# Patient Record
Sex: Female | Born: 1945
Health system: Southern US, Community
[De-identification: ages and names within clinical notes are randomized; demographics above are authoritative.]

## PROBLEM LIST (undated history)

## (undated) DIAGNOSIS — M1990 Unspecified osteoarthritis, unspecified site: Secondary | ICD-10-CM

## (undated) DIAGNOSIS — I452 Bifascicular block: Secondary | ICD-10-CM

## (undated) DIAGNOSIS — E785 Hyperlipidemia, unspecified: Secondary | ICD-10-CM

## (undated) DIAGNOSIS — T7840XA Allergy, unspecified, initial encounter: Secondary | ICD-10-CM

## (undated) DIAGNOSIS — G8929 Other chronic pain: Secondary | ICD-10-CM

## (undated) DIAGNOSIS — L719 Rosacea, unspecified: Secondary | ICD-10-CM

## (undated) DIAGNOSIS — M542 Cervicalgia: Secondary | ICD-10-CM

## (undated) HISTORY — DX: Cervicalgia: M54.2

## (undated) HISTORY — PX: OTHER SURGICAL HISTORY: SHX169

## (undated) HISTORY — DX: Allergy, unspecified, initial encounter: T78.40XA

## (undated) HISTORY — PX: TUBAL LIGATION: SHX77

## (undated) HISTORY — PX: TONSILLECTOMY: SUR1361

## (undated) HISTORY — PX: CHOLECYSTECTOMY: SHX55

## (undated) HISTORY — PX: HYSTEROSCOPY: SHX211

## (undated) HISTORY — DX: Rosacea, unspecified: L71.9

## (undated) HISTORY — DX: Other chronic pain: G89.29

## (undated) HISTORY — DX: Bifascicular block: I45.2

## (undated) HISTORY — DX: Hyperlipidemia, unspecified: E78.5

---

## 1999-06-24 ENCOUNTER — Other Ambulatory Visit: Admission: RE | Admit: 1999-06-24 | Discharge: 1999-06-24 | Payer: Self-pay | Admitting: Gynecology

## 2000-05-24 ENCOUNTER — Other Ambulatory Visit: Admission: RE | Admit: 2000-05-24 | Discharge: 2000-05-24 | Payer: Self-pay | Admitting: Gynecology

## 2001-05-25 ENCOUNTER — Other Ambulatory Visit: Admission: RE | Admit: 2001-05-25 | Discharge: 2001-05-25 | Payer: Self-pay | Admitting: Gynecology

## 2001-05-26 ENCOUNTER — Encounter: Payer: Self-pay | Admitting: Gynecology

## 2001-05-26 ENCOUNTER — Encounter: Admission: RE | Admit: 2001-05-26 | Discharge: 2001-05-26 | Payer: Self-pay | Admitting: Gynecology

## 2001-09-25 ENCOUNTER — Encounter: Payer: Self-pay | Admitting: Gynecology

## 2001-09-25 ENCOUNTER — Encounter: Admission: RE | Admit: 2001-09-25 | Discharge: 2001-09-25 | Payer: Self-pay | Admitting: Gynecology

## 2002-06-07 ENCOUNTER — Other Ambulatory Visit: Admission: RE | Admit: 2002-06-07 | Discharge: 2002-06-07 | Payer: Self-pay | Admitting: Gynecology

## 2002-06-07 ENCOUNTER — Encounter: Admission: RE | Admit: 2002-06-07 | Discharge: 2002-06-07 | Payer: Self-pay | Admitting: Gynecology

## 2002-06-07 ENCOUNTER — Encounter: Payer: Self-pay | Admitting: Gynecology

## 2003-06-11 ENCOUNTER — Other Ambulatory Visit: Admission: RE | Admit: 2003-06-11 | Discharge: 2003-06-11 | Payer: Self-pay | Admitting: Gynecology

## 2003-10-07 ENCOUNTER — Encounter: Admission: RE | Admit: 2003-10-07 | Discharge: 2003-10-07 | Payer: Self-pay | Admitting: Gynecology

## 2003-11-04 ENCOUNTER — Ambulatory Visit (HOSPITAL_COMMUNITY): Admission: RE | Admit: 2003-11-04 | Discharge: 2003-11-04 | Payer: Self-pay | Admitting: Gastroenterology

## 2003-11-04 ENCOUNTER — Encounter: Payer: Self-pay | Admitting: Internal Medicine

## 2004-03-12 ENCOUNTER — Ambulatory Visit (HOSPITAL_BASED_OUTPATIENT_CLINIC_OR_DEPARTMENT_OTHER): Admission: RE | Admit: 2004-03-12 | Discharge: 2004-03-12 | Payer: Self-pay | Admitting: Gynecology

## 2004-03-12 ENCOUNTER — Encounter (INDEPENDENT_AMBULATORY_CARE_PROVIDER_SITE_OTHER): Payer: Self-pay | Admitting: Specialist

## 2004-03-12 ENCOUNTER — Ambulatory Visit (HOSPITAL_COMMUNITY): Admission: RE | Admit: 2004-03-12 | Discharge: 2004-03-12 | Payer: Self-pay | Admitting: Gynecology

## 2004-06-16 ENCOUNTER — Other Ambulatory Visit: Admission: RE | Admit: 2004-06-16 | Discharge: 2004-06-16 | Payer: Self-pay | Admitting: Gynecology

## 2004-10-08 ENCOUNTER — Ambulatory Visit (HOSPITAL_COMMUNITY): Admission: RE | Admit: 2004-10-08 | Discharge: 2004-10-08 | Payer: Self-pay | Admitting: Gynecology

## 2004-10-20 ENCOUNTER — Ambulatory Visit: Payer: Self-pay | Admitting: Internal Medicine

## 2005-07-28 ENCOUNTER — Other Ambulatory Visit: Admission: RE | Admit: 2005-07-28 | Discharge: 2005-07-28 | Payer: Self-pay | Admitting: Gynecology

## 2005-08-09 ENCOUNTER — Ambulatory Visit: Payer: Self-pay | Admitting: Internal Medicine

## 2005-08-16 ENCOUNTER — Ambulatory Visit: Payer: Self-pay | Admitting: Internal Medicine

## 2005-09-29 ENCOUNTER — Ambulatory Visit: Payer: Self-pay | Admitting: Internal Medicine

## 2005-10-18 ENCOUNTER — Ambulatory Visit (HOSPITAL_COMMUNITY): Admission: RE | Admit: 2005-10-18 | Discharge: 2005-10-18 | Payer: Self-pay | Admitting: Gynecology

## 2005-11-18 ENCOUNTER — Ambulatory Visit: Payer: Self-pay | Admitting: Internal Medicine

## 2006-04-19 ENCOUNTER — Ambulatory Visit: Payer: Self-pay | Admitting: Internal Medicine

## 2006-08-17 ENCOUNTER — Ambulatory Visit: Payer: Self-pay | Admitting: Internal Medicine

## 2006-09-05 ENCOUNTER — Encounter: Payer: Self-pay | Admitting: Internal Medicine

## 2006-09-05 ENCOUNTER — Ambulatory Visit: Payer: Self-pay | Admitting: Internal Medicine

## 2006-09-05 DIAGNOSIS — E785 Hyperlipidemia, unspecified: Secondary | ICD-10-CM | POA: Insufficient documentation

## 2006-09-05 DIAGNOSIS — J309 Allergic rhinitis, unspecified: Secondary | ICD-10-CM | POA: Insufficient documentation

## 2006-09-05 LAB — CONVERTED CEMR LAB
Cholesterol, target level: 200 mg/dL
HDL goal, serum: 40 mg/dL
LDL Goal: 160 mg/dL

## 2006-09-19 ENCOUNTER — Other Ambulatory Visit: Admission: RE | Admit: 2006-09-19 | Discharge: 2006-09-19 | Payer: Self-pay | Admitting: Gynecology

## 2006-10-18 ENCOUNTER — Ambulatory Visit: Payer: Self-pay | Admitting: Internal Medicine

## 2006-10-19 ENCOUNTER — Ambulatory Visit (HOSPITAL_COMMUNITY): Admission: RE | Admit: 2006-10-19 | Discharge: 2006-10-19 | Payer: Self-pay | Admitting: Gynecology

## 2007-09-11 ENCOUNTER — Ambulatory Visit: Payer: Self-pay | Admitting: Internal Medicine

## 2007-09-11 LAB — CONVERTED CEMR LAB
ALT: 27 units/L (ref 0–35)
AST: 26 units/L (ref 0–37)
Albumin: 4.2 g/dL (ref 3.5–5.2)
Alkaline Phosphatase: 63 units/L (ref 39–117)
BUN: 21 mg/dL (ref 6–23)
Basophils Absolute: 0 10*3/uL (ref 0.0–0.1)
Basophils Relative: 0.8 % (ref 0.0–1.0)
Bilirubin Urine: NEGATIVE
Bilirubin, Direct: 0.2 mg/dL (ref 0.0–0.3)
CO2: 30 meq/L (ref 19–32)
Calcium: 9.6 mg/dL (ref 8.4–10.5)
Chloride: 106 meq/L (ref 96–112)
Cholesterol: 205 mg/dL (ref 0–200)
Creatinine, Ser: 0.8 mg/dL (ref 0.4–1.2)
Direct LDL: 143.4 mg/dL
Eosinophils Absolute: 0.2 10*3/uL (ref 0.0–0.6)
Eosinophils Relative: 4.9 % (ref 0.0–5.0)
GFR calc Af Amer: 93 mL/min
GFR calc non Af Amer: 77 mL/min
Glucose, Bld: 100 mg/dL — ABNORMAL HIGH (ref 70–99)
Glucose, Urine, Semiquant: NEGATIVE
HCT: 41.2 % (ref 36.0–46.0)
HDL: 48.4 mg/dL (ref >39.0)
Hemoglobin: 14.2 g/dL (ref 12.0–15.0)
Ketones, urine, test strip: NEGATIVE
Lymphocytes Relative: 37.8 % (ref 12.0–46.0)
MCHC: 34.4 g/dL (ref 30.0–36.0)
MCV: 90.8 fL (ref 78.0–100.0)
Monocytes Absolute: 0.3 10*3/uL (ref 0.2–0.7)
Monocytes Relative: 7 % (ref 3.0–11.0)
Neutro Abs: 2.5 10*3/uL (ref 1.4–7.7)
Neutrophils Relative %: 49.5 % (ref 43.0–77.0)
Nitrite: NEGATIVE
Platelets: 241 10*3/uL (ref 150–400)
Potassium: 5 meq/L (ref 3.5–5.1)
RBC: 4.54 M/uL (ref 3.87–5.11)
RDW: 12.7 % (ref 11.5–14.6)
Sodium: 143 meq/L (ref 135–145)
Specific Gravity, Urine: 1.02
TSH: 3.22 microintl units/mL (ref 0.35–5.50)
Total Bilirubin: 0.7 mg/dL (ref 0.3–1.2)
Total CHOL/HDL Ratio: 4.2
Total Protein: 6.5 g/dL (ref 6.0–8.3)
Triglycerides: 80 mg/dL (ref 0–149)
Urobilinogen, UA: NEGATIVE
VLDL: 16 mg/dL (ref 0–40)
WBC: 4.9 10*3/uL (ref 4.5–10.5)
pH: 6

## 2007-09-25 ENCOUNTER — Ambulatory Visit: Payer: Self-pay | Admitting: Internal Medicine

## 2007-09-25 DIAGNOSIS — R82998 Other abnormal findings in urine: Secondary | ICD-10-CM | POA: Insufficient documentation

## 2007-09-25 LAB — CONVERTED CEMR LAB: Pap Smear: NORMAL

## 2007-10-23 ENCOUNTER — Ambulatory Visit (HOSPITAL_COMMUNITY): Admission: RE | Admit: 2007-10-23 | Discharge: 2007-10-23 | Payer: Self-pay | Admitting: Gynecology

## 2007-11-01 ENCOUNTER — Encounter: Admission: RE | Admit: 2007-11-01 | Discharge: 2007-11-01 | Payer: Self-pay | Admitting: Gynecology

## 2007-11-17 ENCOUNTER — Ambulatory Visit: Payer: Self-pay | Admitting: Internal Medicine

## 2008-01-23 ENCOUNTER — Telehealth: Payer: Self-pay | Admitting: Internal Medicine

## 2008-07-18 ENCOUNTER — Encounter: Payer: Self-pay | Admitting: Internal Medicine

## 2008-09-18 ENCOUNTER — Ambulatory Visit: Payer: Self-pay | Admitting: Internal Medicine

## 2008-09-18 LAB — CONVERTED CEMR LAB
ALT: 31 units/L (ref 0–35)
AST: 24 units/L (ref 0–37)
Alkaline Phosphatase: 76 units/L (ref 39–117)
Basophils Absolute: 0 10*3/uL (ref 0.0–0.1)
Basophils Relative: 0.4 % (ref 0.0–3.0)
Bilirubin, Direct: 0.1 mg/dL (ref 0.0–0.3)
CO2: 30 meq/L (ref 19–32)
Chloride: 106 meq/L (ref 96–112)
Creatinine, Ser: 0.8 mg/dL (ref 0.4–1.2)
Direct LDL: 132.6 mg/dL
Eosinophils Absolute: 0.2 10*3/uL (ref 0.0–0.7)
GFR calc non Af Amer: 77 mL/min
HDL: 55.8 mg/dL (ref >39.0)
Lymphocytes Relative: 32.7 % (ref 12.0–46.0)
MCHC: 34.9 g/dL (ref 30.0–36.0)
MCV: 92.8 fL (ref 78.0–100.0)
Neutrophils Relative %: 57.9 % (ref 43.0–77.0)
Nitrite: NEGATIVE
Platelets: 229 10*3/uL (ref 150–400)
Potassium: 4.2 meq/L (ref 3.5–5.1)
RBC: 4.68 M/uL (ref 3.87–5.11)
Sodium: 144 meq/L (ref 135–145)
Total Bilirubin: 0.7 mg/dL (ref 0.3–1.2)
Triglycerides: 75 mg/dL (ref 0–149)
VLDL: 15 mg/dL (ref 0–40)
WBC Urine, dipstick: NEGATIVE
WBC: 5.9 10*3/uL (ref 4.5–10.5)
pH: 5.5

## 2008-09-25 ENCOUNTER — Ambulatory Visit: Payer: Self-pay | Admitting: Internal Medicine

## 2008-09-25 DIAGNOSIS — R319 Hematuria, unspecified: Secondary | ICD-10-CM | POA: Insufficient documentation

## 2008-09-25 LAB — CONVERTED CEMR LAB
Bilirubin Urine: NEGATIVE
Blood in Urine, dipstick: NEGATIVE
Ketones, urine, test strip: NEGATIVE
Nitrite: NEGATIVE
Protein, U semiquant: NEGATIVE
Urobilinogen, UA: 0.2

## 2008-09-30 ENCOUNTER — Encounter: Payer: Self-pay | Admitting: Internal Medicine

## 2008-11-06 ENCOUNTER — Ambulatory Visit (HOSPITAL_COMMUNITY): Admission: RE | Admit: 2008-11-06 | Discharge: 2008-11-06 | Payer: Self-pay | Admitting: Gynecology

## 2008-11-11 ENCOUNTER — Encounter: Payer: Self-pay | Admitting: Internal Medicine

## 2008-11-14 ENCOUNTER — Encounter: Payer: Self-pay | Admitting: Internal Medicine

## 2008-11-14 ENCOUNTER — Encounter: Admission: RE | Admit: 2008-11-14 | Discharge: 2008-11-14 | Payer: Self-pay | Admitting: Gynecology

## 2008-11-18 ENCOUNTER — Ambulatory Visit: Payer: Self-pay | Admitting: Internal Medicine

## 2008-12-05 ENCOUNTER — Encounter: Payer: Self-pay | Admitting: Internal Medicine

## 2008-12-26 ENCOUNTER — Encounter: Payer: Self-pay | Admitting: Internal Medicine

## 2008-12-26 ENCOUNTER — Encounter: Admission: RE | Admit: 2008-12-26 | Discharge: 2008-12-26 | Payer: Self-pay | Admitting: Surgery

## 2008-12-31 ENCOUNTER — Encounter: Payer: Self-pay | Admitting: Internal Medicine

## 2009-01-02 ENCOUNTER — Encounter: Payer: Self-pay | Admitting: Internal Medicine

## 2009-01-21 ENCOUNTER — Encounter: Payer: Self-pay | Admitting: Internal Medicine

## 2009-01-22 ENCOUNTER — Telehealth: Payer: Self-pay | Admitting: Internal Medicine

## 2009-04-25 ENCOUNTER — Telehealth (INDEPENDENT_AMBULATORY_CARE_PROVIDER_SITE_OTHER): Payer: Self-pay | Admitting: *Deleted

## 2009-04-30 ENCOUNTER — Encounter: Admission: RE | Admit: 2009-04-30 | Discharge: 2009-04-30 | Payer: Self-pay | Admitting: Internal Medicine

## 2009-05-19 ENCOUNTER — Encounter: Payer: Self-pay | Admitting: Internal Medicine

## 2009-10-22 ENCOUNTER — Ambulatory Visit: Payer: Self-pay | Admitting: Internal Medicine

## 2009-10-22 LAB — CONVERTED CEMR LAB: Vit D, 25-Hydroxy: 62 ng/mL (ref 30–89)

## 2009-10-27 LAB — CONVERTED CEMR LAB
Alkaline Phosphatase: 72 units/L (ref 39–117)
BUN: 19 mg/dL (ref 6–23)
Bilirubin, Direct: 0 mg/dL (ref 0.0–0.3)
Calcium: 9.4 mg/dL (ref 8.4–10.5)
Chloride: 106 meq/L (ref 96–112)
Cholesterol: 236 mg/dL — ABNORMAL HIGH (ref 0–200)
Creatinine, Ser: 0.8 mg/dL (ref 0.4–1.2)
Direct LDL: 164.4 mg/dL
Eosinophils Absolute: 0.1 10*3/uL (ref 0.0–0.7)
Eosinophils Relative: 1.9 % (ref 0.0–5.0)
Lymphocytes Relative: 32.8 % (ref 12.0–46.0)
MCHC: 33.7 g/dL (ref 30.0–36.0)
MCV: 94.5 fL (ref 78.0–100.0)
Monocytes Absolute: 0.3 10*3/uL (ref 0.1–1.0)
Neutrophils Relative %: 59 % (ref 43.0–77.0)
Platelets: 235 10*3/uL (ref 150.0–400.0)
Total Bilirubin: 1 mg/dL (ref 0.3–1.2)
Total CHOL/HDL Ratio: 4
Triglycerides: 90 mg/dL (ref 0.0–149.0)
VLDL: 18 mg/dL (ref 0.0–40.0)
WBC: 5.8 10*3/uL (ref 4.5–10.5)

## 2009-11-13 ENCOUNTER — Encounter: Admission: RE | Admit: 2009-11-13 | Discharge: 2009-11-13 | Payer: Self-pay | Admitting: Gynecology

## 2009-11-29 HISTORY — PX: EYE SURGERY: SHX253

## 2010-01-13 ENCOUNTER — Ambulatory Visit: Payer: Self-pay | Admitting: Family Medicine

## 2010-05-06 ENCOUNTER — Telehealth: Payer: Self-pay | Admitting: Internal Medicine

## 2010-06-15 ENCOUNTER — Encounter: Payer: Self-pay | Admitting: Internal Medicine

## 2010-09-03 ENCOUNTER — Ambulatory Visit: Payer: Self-pay | Admitting: Internal Medicine

## 2010-09-03 LAB — CONVERTED CEMR LAB
HCV Ab: NEGATIVE
Hep B Core Total Ab: NEGATIVE

## 2010-10-26 ENCOUNTER — Ambulatory Visit: Payer: Self-pay | Admitting: Internal Medicine

## 2010-10-26 LAB — CONVERTED CEMR LAB
AST: 24 units/L (ref 0–37)
Albumin: 4.3 g/dL (ref 3.5–5.2)
Alkaline Phosphatase: 72 units/L (ref 39–117)
Basophils Absolute: 0 10*3/uL (ref 0.0–0.1)
Basophils Relative: 0.8 % (ref 0.0–3.0)
CO2: 29 meq/L (ref 19–32)
Calcium: 9.5 mg/dL (ref 8.4–10.5)
Cholesterol: 221 mg/dL — ABNORMAL HIGH (ref 0–200)
Creatinine, Ser: 0.8 mg/dL (ref 0.4–1.2)
Eosinophils Absolute: 0.2 10*3/uL (ref 0.0–0.7)
MCHC: 34.4 g/dL (ref 30.0–36.0)
MCV: 92.4 fL (ref 78.0–100.0)
Monocytes Absolute: 0.3 10*3/uL (ref 0.1–1.0)
Neutrophils Relative %: 54.4 % (ref 43.0–77.0)
Platelets: 261 10*3/uL (ref 150.0–400.0)
RDW: 14 % (ref 11.5–14.6)
TSH: 3.42 microintl units/mL (ref 0.35–5.50)
Triglycerides: 137 mg/dL (ref 0.0–149.0)

## 2010-10-30 LAB — CONVERTED CEMR LAB: Vit D, 25-Hydroxy: 84 ng/mL (ref 30–89)

## 2010-11-16 ENCOUNTER — Encounter
Admission: RE | Admit: 2010-11-16 | Discharge: 2010-11-16 | Payer: Self-pay | Source: Home / Self Care | Attending: Gynecology | Admitting: Gynecology

## 2010-12-20 ENCOUNTER — Encounter: Payer: Self-pay | Admitting: Gynecology

## 2010-12-29 NOTE — Assessment & Plan Note (Signed)
Summary: ear and neck clogged-ok per Rhonda [name]//ccm   Vital Signs:  Patient profile:   65 year old female Menstrual status:  postmenopausal Weight:      135 pounds Temp:     97.9 degrees F oral BP sitting:   110 / 78  (left arm) Cuff size:   regular  Vitals Entered By: Rhonda [name]) (January 13, 2010 9:28 AM)  Reason for Visit right ear pain and sore throat  History of Present Illness: Rhonda [name] is a 65 year old female, who comes in today for evaluation of a sore throat, and a right-sided earache x 1 day.  She states that her swimming couch at the [place] has had strep throat.  Yesterday she developed a sore throat, and a right-sided earache.  No fever, chills, cough, etc.    Allergies: No Known Drug Allergies  Past History:  Past medical, surgical, family and social histories (including risk factors) reviewed for relevance to current acute and chronic problems.  Past Medical History: Reviewed history from 09/05/2006 and no changes required. Rosacea Allergic rhinitis Hyperlipidemia chronic intermittent neck pain RBBB  Past Surgical History: Reviewed history from 10/22/2009 and no changes required. Tubal ligation Tonsillectomy BCC excision hysteroscopy Cholecystectomy  Family History: Reviewed history from 09/05/2006 and no changes required. Father: 6 mother: 43, Intraperitoneal catastrophe Brother: 2 alive and well Sisters 2 alive and well  Social History: Reviewed history from 09/05/2006 and no changes required. Occupation: Former Smoker Alcohol use-yes Regular exercise-yes walks daily in addition to water aerobics  Review of Systems      See HPI  Physical Exam  General:  Well-developed,well-nourished,in no acute distress; alert,appropriate and cooperative throughout examination Head:  Normocephalic and atraumatic without obvious abnormalities. No apparent alopecia or balding. Eyes:  No corneal or conjunctival inflammation noted. EOMI. Perrla.  Funduscopic exam benign, without hemorrhages, exudates or papilledema. Vision grossly normal. Ears:  External ear exam shows no significant lesions or deformities.  Otoscopic examination reveals clear canals, tympanic membranes are intact bilaterally without bulging, retraction, inflammation or discharge. Hearing is grossly normal bilaterally. Nose:  External nasal examination shows no deformity or inflammation. Nasal mucosa are pink and moist without lesions or exudates. Mouth:  red throat.  Strep test done Neck:  No deformities, masses, or tenderness noted.   Problems:  Medical Problems Added: 1)  Dx of Sore Throat  (ICD-462) 2)  Dx of Viral Infection-unspec  (ICD-079.99)  Impression & Recommendations:  Problem # 1:  VIRAL INFECTION-UNSPEC (ICD-079.99) Assessment New  Her updated medication list for this problem includes:    Zyrtec-d Allergy & Congestion 5-120 Mg Tb12 (Cetirizine-pseudoephedrine) ..... One by mouth daily    Hydromet 5-1.5 Mg/28ml Syrp (Hydrocodone-homatropine) .Marland Kitchen... 1 or 2 tsps at bedtime as needed  Complete Medication List: 1)  Vivelle-dot 0.025 Mg/24hr Pttw (Estradiol) .... 2 x weekly 2)  Prometrium 200 Mg Caps (Progesterone micronized) .... Q 4 months 3)  Zyrtec-d Allergy & Congestion 5-120 Mg Tb12 (Cetirizine-pseudoephedrine) .... One by mouth daily 4)  Vitamin D 1000 Unit Caps (Cholecalciferol) .... Two times a day 5)  Chlorpheniramine Maleate Cr 12 Mg Cpcr (Chlorpheniramine maleate) .Marland Kitchen.. 1 at bedtime 6)  Metrogel 1 % Gel (Metronidazole) .... As directed 7)  Magnesium 250 Mg Tabs (Magnesium) .... Once daily 8)  Calcium Carbonate 600 Mg Tabs (Calcium carbonate) .Marland Kitchen.. 1 1/2 once daily 9)  Hydromet 5-1.5 Mg/83ml Syrp (Hydrocodone-homatropine) .Marland Kitchen.. 1 or 2 tsps at bedtime as needed  Other Orders: Rapid Strep (10272)  Patient Instructions: 1)  drink  lots of water, Chloraseptic lozenges help.  He uses sore throat.  Also, one or 2 teaspoons of Hydromet at bedtime 2)   Please schedule a follow-up appointment as needed. Prescriptions: HYDROMET 5-1.5 MG/5ML SYRP (HYDROCODONE-HOMATROPINE) 1 or 2 tsps at bedtime as needed  #8oz x 1   Entered and Authorized by:   Roderick Pee MD   Signed by:   Roderick Pee MD on 01/13/2010   Method used:   Print then Give to Patient   RxID:   [id number]   Laboratory Results  Date/Time Received: January 13, 2010   Other Tests  Rapid Strep: negative Comments: Rhonda [name])  January 13, 2010 10:22 AM

## 2010-12-29 NOTE — Assessment & Plan Note (Signed)
Summary: flu shot/pt req pneumonia vaccine if needed//cjr   Nurse Visit   Allergies: No Known Drug Allergies  Flu Vaccine Consent Questions:    Do you have a history of severe allergic reactions to this vaccine? no  Orders Added: 1)  Admin 1st Vaccine [90471] 2)  Flu Vaccine 69yrs + [16109] Flu Vaccine Consent Questions     Do you have a history of severe allergic reactions to this vaccine? no    Any prior history of allergic reactions to egg and/or gelatin? no    Do you have a sensitivity to the preservative Thimersol? no    Do you have a past history of Guillan-Barre Syndrome? no    Do you currently have an acute febrile illness? no    Have you ever had a severe reaction to latex? no    Vaccine information given and explained to patient? yes    Are you currently pregnant? no    Lot Number:[id number]   Exp Date:05/29/2011   Site Given  Left Deltoid IM .lbflu

## 2010-12-29 NOTE — Letter (Signed)
Summary: [facility]  [facility]   Imported By: Maryln Gottron 06/19/2010 11:10:09  _____________________________________________________________________  External Attachment:    Type:   Image     Comment:   External Document

## 2010-12-29 NOTE — Progress Notes (Signed)
Summary: question  Phone Note Call from Patient Call back at Home Phone [phone]   Caller: Patient via note Call For: Birdie Sons MD Summary of Call: Has cataracts and wants to have surgery.  No longer has faith with [place] opthalmologist.  Asking if you would recommend Dr Haskell Riling from [facility].  Has appt but wants your input. Do you have an opinion about [facility] in [place] or this MD?  Makes note to thank you for your advise. Wants you to know she has "quite a correction as was on straight oxygen for 5 weeks as a newborn".  OK to leave message on answering machine. Initial call taken by: Gladis Riffle, RN,  May 06, 2010 12:21 PM  Follow-up for Phone Call        [facility] Armstead [facility] also good Follow-up by: Birdie Sons MD,  May 06, 2010 4:26 PM  Additional Follow-up for Phone Call Additional follow up Details #1::        Patient notified.  Additional Follow-up by: Gladis Riffle, RN,  May 07, 2010 8:24 AM

## 2010-12-29 NOTE — Assessment & Plan Note (Signed)
Summary: PT WILL COME FASTING/NJR   Vital Signs:  Patient profile:   65 year old female Menstrual status:  postmenopausal Height:      62.25 inches Weight:      132 pounds BMI:     24.04 Temp:     98.0 degrees F oral Pulse rate:   76 / minute Pulse rhythm:   regular BP sitting:   104 / 68  (left arm) Cuff size:   regular  Vitals Entered By: Alfred Levins, CMA (October 26, 2010 8:08 AM) CC: cpx, no pap, fasting   CC:  cpx, no pap, and fasting.  History of Present Illness: Here for Medicare AWV:  1.   Risk factors based on Past M, S, F history: see patient record. 2.   Physical Activities: she remains physically active. 3.   Depression/mood: she denies. 4.   Hearing: no concerns. 5.   ADL's: she performs all of her ADLs. 6.   Fall Risk: none noted. 7.   Home Safety: no concerns. 8.   Height, weight, &visual acuity:see physical exam. She gets yearly ophthalmologic exam. 9.   Counseling: advised to maintain physical activity. He should exercise at least 30 minutes every day. 10.   Labs ordered based on risk factors: see orders.  11.           Referral Coordination: none necessary. 12.           Care Plan: advised regular exercise. 13.            Cognitive Assessment: no concerns.   Current Problems:  CHOLEDOCHOLITHIASIS (ICD-574.50)--no symptoms. HEMATURIA (ICD-599.70)--chronic problem. She has had evaluated. HYPERLIPIDEMIA (ICD-272.4)--not on any medications.  ALLERGIC RHINITIS (ICD-477.9)-doing well on current regimen.    Current Problems (verified): 1)  Choledocholithiasis  (ICD-574.50) 2)  Hematuria  (ICD-599.70) 3)  Pyuria  (ICD-791.9) 4)  Physical Examination  (ICD-V70.0) 5)  Hyperlipidemia  (ICD-272.4) 6)  Allergic Rhinitis  (ICD-477.9)  Current Medications (verified): 1)  Vivelle-Dot 0.025 Mg/24hr  Pttw (Estradiol) .... 2 X Weekly 2)  Prometrium 200 Mg Caps (Progesterone Micronized) .... Q 4 Months 3)  Zyrtec-D Allergy & Congestion 5-120 Mg  Tb12  (Cetirizine-Pseudoephedrine) .... One By Mouth Daily 4)  Vitamin D 1000 Unit Caps (Cholecalciferol) .... Two Times A Day 5)  Chlorpheniramine Maleate Cr 12 Mg  Cpcr (Chlorpheniramine Maleate) .Marland Kitchen.. 1 At Bedtime 6)  Metrogel 1 %  Gel (Metronidazole) .... As Directed 7)  Magnesium 250 Mg Tabs (Magnesium) .... Once Daily 8)  Calcium Carbonate 600 Mg Tabs (Calcium Carbonate) .Marland Kitchen.. 1 1/2 Once Daily  Allergies (verified): No Known Drug Allergies  Past History:  Past Medical History: Last updated: 09/05/2006 Rosacea Allergic rhinitis Hyperlipidemia chronic intermittent neck pain RBBB  Past Surgical History: Last updated: 10/22/2009 Tubal ligation Tonsillectomy BCC excision hysteroscopy Cholecystectomy  Family History: Last updated: 10/26/2010 Father: 42--? cancer mother: 22, Intraperitoneal catastrophe Brother: 2 alive and well Sisters 2 alive and well  Social History: Last updated: 10/26/2010 Occupation: retired Former Smoker Alcohol use-yes Regular exercise-yes walks daily in addition to water aerobics  Risk Factors: Exercise: yes (09/05/2006)  Risk Factors: Smoking Status: quit > 6 months (10/22/2009)  Family History: Father: 81--? cancer mother: 56, Intraperitoneal catastrophe Brother: 2 alive and well Sisters 2 alive and well  Social History: Occupation: retired Former Smoker Alcohol use-yes Regular exercise-yes walks daily in addition to water aerobics  Physical Exam  General:  well-developed well-nourished female in no acute distress. HEENT exam atraumatic, normocephalic symmetric muscles are intact.  Neck is supple without lymphadenopathy or thyromegaly. Chest clear to auscultation. Cardiac exam S1-S2 are regular without murmurs or gallops heard abdominal exam active bowel sounds, soft and nontender. No hepatosplenomegaly. Aorta is normal size. Extremities no clubbing cyanosis or edema. Neurologic exam she is alert without any motor or sensory  deficits.   Impression & Recommendations:  Problem # 1:  PHYSICAL EXAMINATION (ICD-V70.0) health maintenance up-to-date. Orders: Medicare -1st Annual Wellness Visit [phone])  Problem # 2:  HYPERLIPIDEMIA (ICD-272.4) check labs today. Orders: TLB-Lipid Panel (80061-LIPID) TLB-TSH (Thyroid Stimulating Hormone) (84443-TSH)  Problem # 3:  HEMATURIA (ICD-599.70) reviewed urology note. We'll check labs today. Orders: Venipuncture (13244) TLB-CBC Platelet - w/Differential (85025-CBCD) TLB-BMP (Basic Metabolic Panel-BMET) (80048-METABOL) UA Dipstick w/o Micro (automated)  (81003)  Complete Medication List: 1)  Vivelle-dot 0.025 Mg/24hr Pttw (Estradiol) .... 2 x weekly 2)  Prometrium 200 Mg Caps (Progesterone micronized) .... Q 4 months 3)  Zyrtec-d Allergy & Congestion 5-120 Mg Tb12 (Cetirizine-pseudoephedrine) .... One by mouth daily 4)  Vitamin D 1000 Unit Caps (Cholecalciferol) .... Two times a day 5)  Chlorpheniramine Maleate Cr 12 Mg Cpcr (Chlorpheniramine maleate) .Marland Kitchen.. 1 at bedtime 6)  Metrogel 1 % Gel (Metronidazole) .... As directed 7)  Magnesium 250 Mg Tabs (Magnesium) .... Once daily 8)  Calcium Carbonate 600 Mg Tabs (Calcium carbonate) .Marland Kitchen.. 1 1/2 once daily  Other Orders: TLB-Hepatic/Liver Function Pnl (80076-HEPATIC) T-Vitamin D (25-Hydroxy) [phone])  Preventive Care Screening  Colonoscopy:    Next Due:  11/2012  Mammogram:    Date:  10/29/2009    Next Due:  11/2011    Results:  normal   Pap Smear:    Date:  10/29/2009    Next Due:  10/2012    Results:  normal   Bone Density:    Date:  10/29/2008    Results:  normal std dev    Orders Added: 1)  Medicare -1st Annual Wellness Visit [G0438] 2)  Est. Patient Level III [44034] 3)  Venipuncture [74259] 4)  TLB-CBC Platelet - w/Differential [85025-CBCD] 5)  TLB-Lipid Panel [80061-LIPID] 6)  TLB-TSH (Thyroid Stimulating Hormone) [84443-TSH] 7)  TLB-Hepatic/Liver Function Pnl [80076-HEPATIC] 8)   TLB-BMP (Basic Metabolic Panel-BMET) [80048-METABOL] 9)  UA Dipstick w/o Micro (automated)  [81003] 10)  T-Vitamin D (25-Hydroxy) [56387-56433]    Preventive Care Screening  Colonoscopy:    Next Due:  11/2012  Mammogram:    Date:  10/29/2009    Next Due:  11/2011    Results:  normal   Pap Smear:    Date:  10/29/2009    Next Due:  10/2012    Results:  normal   Bone Density:    Date:  10/29/2008    Results:  normal std dev   Appended Document: Orders Update     Clinical Lists Changes  Orders: Added new Service order of Specimen Handling (29518) - Signed      Appended Document: PT WILL COME FASTING/NJR  Laboratory Results   Urine Tests    Routine Urinalysis   Color: yellow Appearance: Clear Glucose: negative   (Normal Range: Negative) Bilirubin: 1+   (Normal Range: Negative) Ketone: trace (5)   (Normal Range: Negative) Spec. Gravity: 1.025   (Normal Range: 1.003-1.035) Blood: negative   (Normal Range: Negative) pH: 7.0   (Normal Range: 5.0-8.0) Protein: trace   (Normal Range: Negative) Urobilinogen: 0.2   (Normal Range: 0-1) Nitrite: negative   (Normal Range: Negative) Leukocyte Esterace: trace   (Normal Range: Negative)    Comments: Rita Ohara  October 26, 2010 2:59 PM      Appended Document: PT WILL COME FASTING/NJR

## 2010-12-31 NOTE — Procedures (Signed)
Summary: Colonoscopy Report/Dr. Danise Edge  Colonoscopy Report/Dr. Danise Edge   Imported By: Maryln Gottron 11/12/2010 12:20:52  _____________________________________________________________________  External Attachment:    Type:   Image     Comment:   External Document

## 2011-03-31 ENCOUNTER — Telehealth: Payer: Self-pay | Admitting: *Deleted

## 2011-03-31 NOTE — Telephone Encounter (Signed)
Pt has a mole on her retina noted by her Opthamalogist.  He told her that usually when that occurs, there is cancer in another part of the body.  Wonders if she should have another colonoscopy.  Last one 2004.  Do you have any other thoughts about if and what she should have screened?

## 2011-04-02 NOTE — Telephone Encounter (Signed)
Dr. Cato Mulligan would like to see the note from the Opthamalogist, and have him recommend what testing he would suggest. We can make a GI referral for consult with Dr. Daphine Deutscher to see what they think about repeating the colonoscopy. Per Dr. Cato Mulligan...Marland KitchenMarland KitchenMarland Kitchenhave consult from Opthamalogist sent to him, schedule appt with GI.  Pt notified and will do this.

## 2011-04-16 NOTE — Op Note (Signed)
NAME:  Rhonda [name], Rhonda [name]                         ACCOUNT NO.:  [id number]   MEDICAL RECORD NO.:  [id number]                   PATIENT TYPE:  AMB   LOCATION:  ENDO                                 FACILITY:  [facility]   PHYSICIAN:  Danise Edge, M.D.                DATE OF BIRTH:  07-07-1946   DATE OF PROCEDURE:  11/04/2003  DATE OF DISCHARGE:                                 OPERATIVE REPORT   PROCEDURE:  Colonoscopy.   REFERRING PHYSICIANS:  Gretta Cool, M.D., and Valetta Mole. Swords, M.D., of  [facility].   PROCEDURE INDICATION:  Ms. Rhonda [name] is a 65 year old female born  24-Oct-1946.  Ms. Rhonda [name] is scheduled to undergo her first screening  colonoscopy with polypectomy to prevent colon cancer.   ENDOSCOPIST:  Danise Edge, M.D.   PREMEDICATION:  Versed 5 mg, Demerol 50 mg.   DESCRIPTION OF PROCEDURE:  After obtaining informed consent, Ms. Rhonda [name] was  placed in the left lateral decubitus position.  I administered intravenous  Demerol and intravenous Versed to achieve conscious sedation for the  procedure.  The patient's blood pressure, oxygen saturation, and cardiac  rhythm were monitored throughout the procedure and documented in the medical  record.   Anal inspection was normal.  Digital rectal exam was normal.  The Olympus  adjustable pediatric colonoscope was introduced into the rectum and advanced  to the cecum.  Colonic preparation for the exam today was excellent.   Rectum normal.   Sigmoid colon and descending colon normal.   Splenic flexure normal.   Transverse colon normal.   Hepatic flexure normal.   Ascending colon normal.   Cecum and ileocecal valve normal.   ASSESSMENT:  Normal screening proctocolonoscopy.  No endoscopic evidence for  the presence of colorectal neoplasia.                                               Danise Edge, M.D.    MJ/MEDQ  D:  11/04/2003  T:  11/04/2003  Job:  [id number]   cc:   Gretta Cool, M.D.  [address] [place]  [place] [address]  Fax: [phone]   Valetta Mole. Swords, M.D. [facility]

## 2011-04-16 NOTE — Op Note (Signed)
NAME:  Rhonda [name], Rhonda [name]                         ACCOUNT NO.:  [id number]   MEDICAL RECORD NO.:  [id number]                   PATIENT TYPE:  AMB   LOCATION:  [facility]                                 FACILITY:  [facility]   PHYSICIAN:  Gretta Cool, M.D.              DATE OF BIRTH:  03/12/1946   DATE OF PROCEDURE:  03/12/2004  DATE OF DISCHARGE:                                 OPERATIVE REPORT   PREOPERATIVE DIAGNOSIS:  Uterine polyp versus fibroids with abnormal uterine  bleeding.   POSTOPERATIVE DIAGNOSIS:  Submucous fibroids, multiple with abnormal uterine  bleeding.   PROCEDURE:  Hysteroscopy, resection of multiple submucous fibroids, total  endometrial resection for ablation.   ANESTHESIA:  IV sedation and paracervical block.   SURGEON:  Gretta Cool, M.D.   DESCRIPTION OF PROCEDURE:  Under excellent anesthesia as above with the  patient prepped and draped in the lithotomy position a weighted speculum was  placed in the vagina and the cervix grasped with a single tooth tenaculum.  The cervix was then progressively dilated with a series of Pratt dilators to  accommodate a 7 mm resectoscope.  The resectoscope was then introduced and  the photographs taken of the large polypoid structure bulging into the  cavity.  Upon resection of the tissue the structure was noted to be a  fibroid.  It extended approximately a centimeter or two out into the  myometrium.  As that was resected and the fibroid manipulated it  progressively extruded itself into the cavity and was resected with great  care to stay inside the capsule of the fibroid.  Once that fibroid was  resected attention was then turned to the resection of the endometrium.  As  the endometrium was resected two more sizable fibroids were identified and  also resected in the same manner.  Smaller than pea-size fibroids also were  noted on the surface of the myometrium after resection.  All of those were  treated in so far as they  could be seen.  Myometrium was resected at least 5  mm deep.  Once all of the endometrial tissue had been removed the cavity was  treated with VaporTrode so as to eliminate any islands of viable endometrial  tissue.  At this point the procedure was terminated without complication and  the patient returned to the recovery room in excellent condition.  There was  no significant fluid deficit, less than 5 mL.   COMPLICATIONS:  None.                                               Gretta Cool, M.D.    CWL/MEDQ  D:  03/12/2004  T:  03/12/2004  Job:  [id number]

## 2011-05-10 ENCOUNTER — Other Ambulatory Visit: Payer: Self-pay | Admitting: Gastroenterology

## 2011-10-04 ENCOUNTER — Ambulatory Visit (INDEPENDENT_AMBULATORY_CARE_PROVIDER_SITE_OTHER): Payer: Medicare Other | Admitting: *Deleted

## 2011-10-04 DIAGNOSIS — Z23 Encounter for immunization: Secondary | ICD-10-CM

## 2011-10-15 DIAGNOSIS — D3131 Benign neoplasm of right choroid: Secondary | ICD-10-CM | POA: Insufficient documentation

## 2011-10-15 DIAGNOSIS — H43813 Vitreous degeneration, bilateral: Secondary | ICD-10-CM | POA: Insufficient documentation

## 2011-10-15 DIAGNOSIS — Z961 Presence of intraocular lens: Secondary | ICD-10-CM | POA: Insufficient documentation

## 2011-10-18 ENCOUNTER — Other Ambulatory Visit: Payer: Self-pay | Admitting: Gynecology

## 2011-10-18 DIAGNOSIS — Z1231 Encounter for screening mammogram for malignant neoplasm of breast: Secondary | ICD-10-CM

## 2011-11-18 ENCOUNTER — Ambulatory Visit (INDEPENDENT_AMBULATORY_CARE_PROVIDER_SITE_OTHER): Payer: Medicare Other | Admitting: Internal Medicine

## 2011-11-18 ENCOUNTER — Ambulatory Visit
Admission: RE | Admit: 2011-11-18 | Discharge: 2011-11-18 | Disposition: A | Discharge status: Home / Self Care | Payer: Medicare Other | Source: Ambulatory Visit | Attending: Gynecology | Admitting: Gynecology

## 2011-11-18 ENCOUNTER — Encounter: Payer: Self-pay | Admitting: Internal Medicine

## 2011-11-18 DIAGNOSIS — Z1231 Encounter for screening mammogram for malignant neoplasm of breast: Secondary | ICD-10-CM

## 2011-11-18 DIAGNOSIS — R319 Hematuria, unspecified: Secondary | ICD-10-CM

## 2011-11-18 DIAGNOSIS — Z0000 Encounter for general adult medical examination without abnormal findings: Secondary | ICD-10-CM

## 2011-11-18 DIAGNOSIS — M179 Osteoarthritis of knee, unspecified: Secondary | ICD-10-CM

## 2011-11-18 DIAGNOSIS — E785 Hyperlipidemia, unspecified: Secondary | ICD-10-CM

## 2011-11-18 DIAGNOSIS — IMO0002 Reserved for concepts with insufficient information to code with codable children: Secondary | ICD-10-CM

## 2011-11-18 DIAGNOSIS — M1710 Unilateral primary osteoarthritis, unspecified knee: Secondary | ICD-10-CM

## 2011-11-18 DIAGNOSIS — M1990 Unspecified osteoarthritis, unspecified site: Secondary | ICD-10-CM | POA: Insufficient documentation

## 2011-11-18 LAB — LIPID PANEL
Cholesterol: 204 mg/dL — ABNORMAL HIGH (ref 0–200)
HDL: 60.9 mg/dL (ref >39.00)
Triglycerides: 117 mg/dL (ref 0.0–149.0)

## 2011-11-18 LAB — POCT URINALYSIS DIPSTICK
Bilirubin, UA: NEGATIVE
Ketones, UA: NEGATIVE
Leukocytes, UA: NEGATIVE

## 2011-11-18 LAB — TSH: TSH: 2.46 u[IU]/mL (ref 0.35–5.50)

## 2011-11-18 LAB — HEPATIC FUNCTION PANEL
ALT: 35 U/L (ref 0–35)
Total Protein: 7.4 g/dL (ref 6.0–8.3)

## 2011-11-18 NOTE — Progress Notes (Signed)
Subjective:    Rhonda [name] is a 65 y.o. female who presents for Medicare Annual/Subsequent preventive examination.  Preventive Screening-Counseling & Management  Tobacco History  Smoking status  . Former Smoker  Smokeless tobacco  . Not on file     Problems Prior to Visit 1.   Current Problems (verified) Patient Active Problem List  Diagnoses  . HYPERLIPIDEMIA  . ALLERGIC RHINITIS  . HEMATURIA  . PYURIA    Medications Prior to Visit No current outpatient prescriptions on file prior to visit.    Current Medications (verified) Current Outpatient Prescriptions  Medication Sig Dispense Refill  . calcium carbonate (OS-CAL) 600 MG TABS Take 600 mg by mouth 2 (two) times daily with a meal.        . cetirizine (ZYRTEC) 10 MG tablet Take 10 mg by mouth daily.        . Chlorpheniramine Maleate CR 12 MG CPCR Take 121 mg by mouth daily.        . cholecalciferol (VITAMIN D) 1000 UNITS tablet Take 1,000 Units by mouth daily.        . Magnesium 250 MG TABS Take 1 tablet by mouth daily.        . meloxicam (MOBIC) 15 MG tablet Take 15 mg by mouth daily.        . metroNIDAZOLE (METROGEL) 1 % gel Apply topically daily.        . progesterone (PROMETRIUM) 200 MG capsule Take 200 mg by mouth as directed.       . vitamin E 200 UNIT capsule Take 200 Units by mouth daily.        Marland Kitchen VIVELLE-DOT 0.05 MG/24HR Take 1 tablet by mouth 2 (two) times a week.         Allergies (verified) Review of patient's allergies indicates no known allergies.   PAST HISTORY  Family History Family History  Problem Relation Age of Onset  . Cancer Father     unknown    Social History History  Substance Use Topics  . Smoking status: Former Games developer  . Smokeless tobacco: Not on file  . Alcohol Use: Yes     Are there smokers in your home (other than you)? No  Risk Factors Current exercise habits: Home exercise routine includes elipticala.  Dietary issues discussed: none   Cardiac risk factors:  advanced age (older than 24 for men, 43 for women).  Depression Screen (Note: if answer to either of the following is "Yes", a more complete depression screening is indicated)   Over the past two weeks, have you felt down, depressed or hopeless? No   Activities of Daily Living In your present state of health, do you have any difficulty performing the following activities?:  Driving? No Managing money?  No    Hearing Difficulties: No Do you often ask people to speak up or repeat themselves? No    Do you feel that you have a problem with memory? No   Cognitive Testing  Alert? Yes  Normal Appearance?Yes  Oriented to person? Yes  Place? Yes     List the Names of Other Physician/Practitioners you currently use: 1.    Indicate any recent Medical Services you may have received from other than [facility] providers in the past year (date may be approximate).  Immunization History  Administered Date(s) Administered  . Influenza Split 10/04/2011  . Influenza Whole 10/22/2009, 09/03/2010  . Pneumococcal Polysaccharide 10/22/2009  . Td 09/25/2008    Screening Tests Health Maintenance  Topic  Date Due  . Colonoscopy  05/15/1994  . Zostavax  05/15/2004  . Influenza Vaccine  08/29/2012  . Mammogram  11/16/2012  . Tetanus/tdap  09/25/2018  . Pneumococcal Polysaccharide Vaccine Age 34 And Over  Completed    All answers were reviewed with the patient and necessary referrals were made:  Judie Petit, MD   11/18/2011   History reviewed: allergies, current medications, past family history, past medical history, past social history, past surgical history and problem list  Review of Systems  patient denies chest pain, shortness of breath, orthopnea. Denies lower extremity edema, abdominal pain, change in appetite, change in bowel movements. Patient denies rashes, musculoskeletal complaints. No other specific complaints in a complete review of systems.    Objective:       There is no  height on file to calculate BMI. BP 110/78  Pulse 76  Temp(Src) 97.5 F (36.4 C) (Oral)  Wt 134 lb (60.782 kg)   Well-developed well-nourished female in no acute distress. HEENT exam atraumatic, normocephalic, extraocular muscles are intact. Neck is supple. No jugular venous distention no thyromegaly. Chest clear to auscultation without increased work of breathing. Cardiac exam S1 and S2 are regular. Abdominal exam active bowel sounds, soft, nontender. Extremities no edema. Neurologic exam she is alert without any motor sensory deficits. Gait is normal.      Assessment:     Well visit- health maint UTD     Plan:     During the course of the visit the patient was educated and counseled about appropriate screening and preventive services including:    Pneumococcal vaccine   Influenza vaccine  Td vaccine  Diet review for nutrition referral? Yes ____  Not Indicated __x__   Patient Instructions (the written plan) was given to the patient.  Medicare Attestation I have personally reviewed: The patient's medical and social history Their use of alcohol, tobacco or illicit drugs Their current medications and supplements The patient's functional ability including ADLs,fall risks, home safety risks, cognitive, and hearing and visual impairment Diet and physical activities Evidence for depression or mood disorders  The patient's weight, height, BMI, and visual acuity have been recorded in the chart.  I have made referrals, counseling, and provided education to the patient based on review of the above and I have provided the patient with a written personalized care plan for preventive services.     Judie Petit, MD   11/18/2011

## 2011-11-25 ENCOUNTER — Other Ambulatory Visit: Payer: Self-pay | Admitting: Gynecology

## 2012-01-12 ENCOUNTER — Ambulatory Visit (INDEPENDENT_AMBULATORY_CARE_PROVIDER_SITE_OTHER): Payer: Medicare Other | Admitting: Family

## 2012-01-12 ENCOUNTER — Encounter: Payer: Self-pay | Admitting: Family

## 2012-01-12 VITALS — BP 114/78 | Temp 98.5°F | Wt 135.0 lb

## 2012-01-12 DIAGNOSIS — N9489 Other specified conditions associated with female genital organs and menstrual cycle: Secondary | ICD-10-CM

## 2012-01-12 DIAGNOSIS — R102 Pelvic and perineal pain: Secondary | ICD-10-CM

## 2012-01-12 DIAGNOSIS — M545 Low back pain, unspecified: Secondary | ICD-10-CM

## 2012-01-12 DIAGNOSIS — R319 Hematuria, unspecified: Secondary | ICD-10-CM

## 2012-01-12 LAB — POCT URINALYSIS DIPSTICK
Bilirubin, UA: NEGATIVE
Glucose, UA: NEGATIVE
Ketones, UA: NEGATIVE
Leukocytes, UA: NEGATIVE
Nitrite, UA: NEGATIVE
Protein, UA: NEGATIVE
Spec Grav, UA: 1.01
Urobilinogen, UA: 0.2
pH, UA: 7

## 2012-01-12 MED ORDER — CIPROFLOXACIN HCL 500 MG PO TABS: 500.0000 mg | ORAL_TABLET | Freq: Two times a day (BID) | ORAL | Status: AC

## 2012-01-12 NOTE — Progress Notes (Signed)
Subjective:    Patient ID: Rhonda [name], female    DOB: 03/22/1946, 66 y.o.   MRN: [id number]  HPI 66 year old white female, nonsmoker, patient of Dr. Cato Mulligan is in today with complaints of low back pain and lower abdominal pain that's been going on for one day. She does report urinary frequency. Describes the pain in her back is constant, and the pain in her abdomen comes and goes. Her last bowel movement was today. Patient denies any visible blood in her urine no foul-smelling urine, diarrhea or constipation, not nausea or vomiting. She also denies injury.   Review of Systems  Constitutional: Negative.   Eyes: Negative.   Respiratory: Negative.   Cardiovascular: Negative.   Gastrointestinal: Positive for abdominal pain.       Lower abdominal pain  Genitourinary: Positive for urgency. Negative for vaginal bleeding and vaginal discharge.  Musculoskeletal: Positive for back pain.       Low back pain  Skin: Negative.   Hematological: Negative.   Psychiatric/Behavioral: Negative.        Objective:   Physical Exam  Constitutional: She is oriented to person, place, and time. She appears well-developed and well-nourished.  Neck: Normal range of motion. Neck supple.  Cardiovascular: Normal rate, regular rhythm and normal heart sounds.   Pulmonary/Chest: Effort normal and breath sounds normal.  Abdominal: Soft. Bowel sounds are normal. She exhibits no distension and no mass. There is no rebound and no guarding.       Mild tenderness noted over the bladder.  Musculoskeletal: Normal range of motion.  Neurological: She is alert and oriented to person, place, and time.  Skin: Skin is warm and dry.  Psychiatric: She has a normal mood and affect.          Assessment & Plan:  Assessment: Abdominal pain, low back pain, microscopic hematuria  Plan: Due to her symptoms, will treat patient with Cipro, 2 mg twice daily x3 days. Send urine for culture and sensitivity. Additional labs to  include BMP and CBC were also sent. Will notify her pending the results of those labs. Patient encouraged to call the office if symptoms worsen or persist, recheck a schedule and when necessary. Note: Patient has seen urology in the past for microscopic hematuria and was deemed to be idiopathic. Past Medical History  Diagnosis Date  . Rosacea   . Allergy   . Hyperlipidemia   . Neck pain, chronic   . RBBB (right bundle branch block with left anterior fascicular block)     History   Social History  . Marital Status: Married    Spouse Name: N/A    Number of Children: N/A  . Years of Education: N/A   Occupational History  . Not on file.   Social History Main Topics  . Smoking status: Former Games developer  . Smokeless tobacco: Not on file  . Alcohol Use: Yes  . Drug Use:   . Sexually Active:    Other Topics Concern  . Not on file   Social History Narrative  . No narrative on file    Past Surgical History  Procedure Date  . Tubal ligation   . Tonsillectomy   . Bcc excision   . Hysteroscopy   . Cholecystectomy     Family History  Problem Relation Age of Onset  . Cancer Father     unknown    No Known Allergies  Current Outpatient Prescriptions on File Prior to Visit  Medication Sig Dispense Refill  .  calcium carbonate (OS-CAL) 600 MG TABS Take 600 mg by mouth 2 (two) times daily with a meal.        . cetirizine (ZYRTEC) 10 MG tablet Take 10 mg by mouth daily.        . Chlorpheniramine Maleate CR 12 MG CPCR Take 121 mg by mouth daily.        . cholecalciferol (VITAMIN D) 1000 UNITS tablet Take 1,000 Units by mouth daily.        . Magnesium 250 MG TABS Take 1 tablet by mouth daily.        . meloxicam (MOBIC) 15 MG tablet Take 15 mg by mouth daily.        . metroNIDAZOLE (METROGEL) 1 % gel Apply topically daily.        . progesterone (PROMETRIUM) 200 MG capsule Take 200 mg by mouth as directed.       Marland Kitchen VIVELLE-DOT 0.05 MG/24HR Take 1 tablet by mouth 2 (two) times a week.       . vitamin E 200 UNIT capsule Take 200 Units by mouth daily.          BP 114/78  Temp(Src) 98.5 F (36.9 C) (Oral)  Wt 135 lb (61.236 kg)chart

## 2012-01-13 LAB — CBC WITH DIFFERENTIAL/PLATELET
Basophils Absolute: 0 10*3/uL (ref 0.0–0.1)
Basophils Relative: 0.3 % (ref 0.0–3.0)
HCT: 40.8 % (ref 36.0–46.0)
Hemoglobin: 13.9 g/dL (ref 12.0–15.0)
Lymphocytes Relative: 11.7 % — ABNORMAL LOW (ref 12.0–46.0)
Lymphs Abs: 1.5 10*3/uL (ref 0.7–4.0)
MCHC: 34.1 g/dL (ref 30.0–36.0)
Monocytes Relative: 4.8 % (ref 3.0–12.0)
Neutro Abs: 10.2 10*3/uL — ABNORMAL HIGH (ref 1.4–7.7)
RBC: 4.4 Mil/uL (ref 3.87–5.11)
RDW: 13.7 % (ref 11.5–14.6)

## 2012-01-13 LAB — BASIC METABOLIC PANEL
CO2: 26 mEq/L (ref 19–32)
Chloride: 105 mEq/L (ref 96–112)
Creatinine, Ser: 0.7 mg/dL (ref 0.4–1.2)
Potassium: 4.9 mEq/L (ref 3.5–5.1)

## 2012-01-14 LAB — URINE CULTURE

## 2012-01-20 ENCOUNTER — Telehealth: Payer: Self-pay | Admitting: Family

## 2012-01-20 ENCOUNTER — Ambulatory Visit (INDEPENDENT_AMBULATORY_CARE_PROVIDER_SITE_OTHER)
Admission: RE | Admit: 2012-01-20 | Discharge: 2012-01-20 | Disposition: A | Discharge status: Home / Self Care | Payer: Medicare Other | Source: Ambulatory Visit | Attending: Family | Admitting: Family

## 2012-01-20 ENCOUNTER — Ambulatory Visit (INDEPENDENT_AMBULATORY_CARE_PROVIDER_SITE_OTHER): Payer: Medicare Other | Admitting: Family

## 2012-01-20 ENCOUNTER — Encounter: Payer: Self-pay | Admitting: Family

## 2012-01-20 VITALS — BP 122/84 | HR 57 | Ht 62.0 in | Wt 135.0 lb

## 2012-01-20 DIAGNOSIS — S299XXA Unspecified injury of thorax, initial encounter: Secondary | ICD-10-CM

## 2012-01-20 DIAGNOSIS — R079 Chest pain, unspecified: Secondary | ICD-10-CM

## 2012-01-20 DIAGNOSIS — R0781 Pleurodynia: Secondary | ICD-10-CM

## 2012-01-20 DIAGNOSIS — S298XXA Other specified injuries of thorax, initial encounter: Secondary | ICD-10-CM

## 2012-01-20 MED ORDER — NAPROXEN 500 MG PO TABS: 500.0000 mg | ORAL_TABLET | Freq: Two times a day (BID) | ORAL | Status: AC

## 2012-01-20 MED ORDER — HYDROCODONE-ACETAMINOPHEN 5-500 MG PO TABS: 1.0000 | ORAL_TABLET | Freq: Three times a day (TID) | ORAL | Status: AC | PRN

## 2012-01-20 MED ORDER — KETOROLAC TROMETHAMINE 60 MG/2ML IM SOLN
60.0000 mg | Freq: Once | INTRAMUSCULAR | Status: AC
  Administered 2012-01-20: 60 mg via INTRAMUSCULAR

## 2012-01-20 NOTE — Telephone Encounter (Signed)
Pt aware.

## 2012-01-20 NOTE — Progress Notes (Signed)
Subjective:    Patient ID: Rhonda [name], female    DOB: [birth year], 66 y.o.   MRN: [id number]  HPI 66 year old white female, nonsmoker, patient of Dr. Roseanne Reno is in today with complaint of right chest wall pain. Patient reports bending over into her dishwasher 5 days ago had 2 sudden cough that caused her to become off balance and hit her right side on the refrigerator, didn't fall to the floor. Since that time she pain on her right chest wall pain an 8/10. The pain is worse with movement, and worse with lying on her back her stomach. Pain is better with sitting. She's taken Advil and Max freeze that has helped her symptoms. Patient denies any lightheadedness, dizziness, chest pain, palpitations, shortness of breath or edema. No loss of consciousness. No head injury.   Review of Systems  Constitutional: Negative.   HENT: Negative.   Eyes: Negative.   Respiratory: Negative.   Cardiovascular: Positive for chest pain. Negative for palpitations and leg swelling.       Chest wall pain after injury   Gastrointestinal: Negative.   Musculoskeletal:       Chest wall pain  Skin: Negative.   Neurological: Negative.  Negative for dizziness, light-headedness and headaches.  Hematological: Negative.   Psychiatric/Behavioral: Negative.    Past Medical History  Diagnosis Date  . Rosacea   . Allergy   . Hyperlipidemia   . Neck pain, chronic   . RBBB (right bundle branch block with left anterior fascicular block)     History   Social History  . Marital Status: Married    Spouse Name: N/A    Number of Children: N/A  . Years of Education: N/A   Occupational History  . Not on file.   Social History Main Topics  . Smoking status: Former Games developer  . Smokeless tobacco: Not on file  . Alcohol Use: Yes  . Drug Use:   . Sexually Active:    Other Topics Concern  . Not on file   Social History Narrative  . No narrative on file    Past Surgical History  Procedure Date  . Tubal ligation    . Tonsillectomy   . Bcc excision   . Hysteroscopy   . Cholecystectomy     Family History  Problem Relation Age of Onset  . Cancer Father     unknown    No Known Allergies  Current Outpatient Prescriptions on File Prior to Visit  Medication Sig Dispense Refill  . calcium carbonate (OS-CAL) 600 MG TABS Take 600 mg by mouth 2 (two) times daily with a meal.        . cetirizine (ZYRTEC) 10 MG tablet Take 10 mg by mouth daily.        . Chlorpheniramine Maleate CR 12 MG CPCR Take 121 mg by mouth daily.        . cholecalciferol (VITAMIN D) 1000 UNITS tablet Take 1,000 Units by mouth daily.        . Magnesium 250 MG TABS Take 1 tablet by mouth daily.        . meloxicam (MOBIC) 15 MG tablet Take 15 mg by mouth daily.        . metroNIDAZOLE (METROGEL) 1 % gel Apply topically daily.        . progesterone (PROMETRIUM) 200 MG capsule Take 200 mg by mouth as directed.       . vitamin E 200 UNIT capsule Take 200 Units by mouth daily.        Marland Kitchen  VIVELLE-DOT 0.05 MG/24HR Take 1 tablet by mouth 2 (two) times a week.      . ciprofloxacin (CIPRO) 500 MG tablet Take 1 tablet (500 mg total) by mouth 2 (two) times daily.  6 tablet  0   No current facility-administered medications on file prior to visit.    BP 122/84  Pulse 57  Wt 135 lb (61.236 kg)  SpO2 95%chart    Objective:   Physical Exam  Constitutional: She appears well-developed and well-nourished.  Neck: Normal range of motion. Neck supple.  Cardiovascular: Normal rate, regular rhythm and normal heart sounds.   Pulmonary/Chest: Effort normal and breath sounds normal. She exhibits tenderness.       Tenderness to palpation of the right posterior lower chest wall. No bruising noted.   Musculoskeletal: Normal range of motion.  Neurological: She is alert.  Skin: Skin is warm and dry.  Psychiatric: She has a normal mood and affect.          Assessment & Plan:  Assessment: Right chest wall pain, right chest wall injury, rule out rib  fracture  Plan: Toradol 60 mg IM x1 given. X-ray of the right ribs. Will notify patient pending results and discuss further treatment plan at that time. In the meantime, ice to the affected area and ice. Call the office if symptoms worsen or persist, recheck as scheduled and when necessary.

## 2012-01-20 NOTE — Patient Instructions (Signed)
1. Report to the [facility] office to have xray of the ribs to rule out fracture.  2. We will contact you pending results to discuss further treatment regimen.  Chest Wall Pain Chest wall pain is pain in or around the bones and muscles of your chest. This may occur:   On its own (spontaneously).   After a viral illness such as the flu.   Through injur.   From coughing.   Minor exercise.  It may take up to 6 weeks to get better; longer if you must stay physically active in your work and activities. HOME CARE INSTRUCTIONS   Avoid over-tiring physical activity. Try not to strain or perform activities which cause pain. This would include any activities using chest, belly (abdominal) and side muscles, especially if heavy weights are used.   Use ice on the painful area for 15 to 20 minutes per hour while awake for the first 2 days. Place the ice in a plastic bag and place a towel between the bag of ice and your skin.   Only take over-the-counter or prescription medicines for pain, discomfort, or fever as directed by your caregiver.  SEEK IMMEDIATE MEDICAL CARE IF:   Your pain increases or you are very uncomfortable.   An oral temperature above 102 F (38.9 C)develops.   Your chest pains become worse.   You develop new, unexplained problems (symptoms).   You develop nausea, vomiting, sweating or feel light headed.   You develop a cough which produces phlegm (sputum) or you cough up blood.  MAKE SURE YOU:   Understand these instructions.   Will watch your condition.   Will get help right away if you are not doing well or get worse.  Document Released: 11/15/2005 Document Revised: 05/31/2011 Document Reviewed: 07/03/2008 [facility] Patient Information 2012 [place], [place].

## 2012-01-20 NOTE — Telephone Encounter (Signed)
Please advise patient that she does not have a rib fracture. The pain is coming from the actual impact (chest wall contusion). I have faxed over pain meds and an anti-inflammatory med to take. Heat to the [facility].

## 2012-01-24 ENCOUNTER — Ambulatory Visit: Payer: Medicare Other | Admitting: Internal Medicine

## 2012-03-05 ENCOUNTER — Ambulatory Visit (INDEPENDENT_AMBULATORY_CARE_PROVIDER_SITE_OTHER): Payer: Medicare Other | Admitting: Family Medicine

## 2012-03-05 DIAGNOSIS — H6980 Other specified disorders of Eustachian tube, unspecified ear: Secondary | ICD-10-CM

## 2012-03-05 DIAGNOSIS — H9209 Otalgia, unspecified ear: Secondary | ICD-10-CM

## 2012-03-05 DIAGNOSIS — H6990 Unspecified Eustachian tube disorder, unspecified ear: Secondary | ICD-10-CM

## 2012-03-05 MED ORDER — CEFUROXIME AXETIL 250 MG PO TABS: 250.0000 mg | ORAL_TABLET | Freq: Two times a day (BID) | ORAL | Status: AC

## 2012-03-05 MED ORDER — MOMETASONE FUROATE 50 MCG/ACT NA SUSP: 2.0000 | Freq: Every day | NASAL | Status: DC

## 2012-03-05 MED ORDER — TRAMADOL HCL 50 MG PO TABS: 50.0000 mg | ORAL_TABLET | Freq: Three times a day (TID) | ORAL | Status: AC | PRN

## 2012-03-05 NOTE — Patient Instructions (Signed)
Continue decongestant.  Can add Nasonex nasal spray for the allergies, and saline nasal spray as needed.  If not improving in next 2 -3 days, can fill antibiotic. Tramadol if needed for increased pain. Return to the clinic or go to the nearest emergency room if any of your symptoms worsen or new symptoms occur.

## 2012-03-05 NOTE — Progress Notes (Signed)
  Subjective:    Patient ID: Rhonda [name], female    DOB: [birth year], 66 y.o.   MRN: [id number]  HPI Rhonda [name] is a 66 y.o. female L ear shooting pains - into front of ear and upper jaw.  Sx's x 2 days.  Has had nasal congestion for awhile with allergies.  Tx: zyrtec D, advil,  Chlorpheniramine.  Slight soreness in R this am, but not like left.   Review of Systems  Constitutional: Negative for fever and chills.  HENT: Positive for hearing loss, ear pain, congestion and rhinorrhea. Negative for sore throat and ear discharge.        Hx of hearing loss, but no recent changes with current sx's.  Respiratory: Negative for cough.        Objective:   Physical Exam  Constitutional: She is oriented to person, place, and time. She appears well-developed and well-nourished. No distress.  HENT:  Head: Normocephalic and atraumatic.  Right Ear: Hearing, tympanic membrane, external ear and ear canal normal. No middle ear effusion.  Left Ear: Hearing, external ear and ear canal normal. Tympanic membrane is retracted. Tympanic membrane is not injected, not perforated and not erythematous. A middle ear effusion is present.  Nose: Mucosal edema present. Right sinus exhibits maxillary sinus tenderness. Left sinus exhibits maxillary sinus tenderness.  Mouth/Throat: Oropharynx is clear and moist. No oropharyngeal exudate.       Minimal max illary sinus ttp.  Eyes: Conjunctivae and EOM are normal. Pupils are equal, round, and reactive to light.  Cardiovascular: Normal rate, regular rhythm, normal heart sounds and intact distal pulses.   No murmur heard. Pulmonary/Chest: Effort normal and breath sounds normal. No respiratory distress. She has no wheezes. She has no rhonchi.  Neurological: She is alert and oriented to person, place, and time.  Skin: Skin is warm and dry. No rash noted.  Psychiatric: She has a normal mood and affect. Her behavior is normal.       Assessment & Plan:  Rhonda [name] is a 66 y.o. female 1. Otalgia   2. Eustachian tube dysfunction   suspect AR with secondary eustachian tube dysfunction, versus early OM, and sinusitis. Start nasonex, cont zyrtec d, saline nasal spray prn, Ultram Q 6 prn, and if not imroving in few days, can start ceftin.  RTC precautions.

## 2012-08-25 DIAGNOSIS — Z961 Presence of intraocular lens: Secondary | ICD-10-CM | POA: Insufficient documentation

## 2012-09-06 DIAGNOSIS — B309 Viral conjunctivitis, unspecified: Secondary | ICD-10-CM | POA: Insufficient documentation

## 2012-09-12 ENCOUNTER — Ambulatory Visit (INDEPENDENT_AMBULATORY_CARE_PROVIDER_SITE_OTHER): Payer: Medicare Other

## 2012-09-12 DIAGNOSIS — Z23 Encounter for immunization: Secondary | ICD-10-CM

## 2012-11-27 ENCOUNTER — Other Ambulatory Visit: Payer: Self-pay | Admitting: Gynecology

## 2012-11-27 DIAGNOSIS — Z1231 Encounter for screening mammogram for malignant neoplasm of breast: Secondary | ICD-10-CM

## 2013-01-02 ENCOUNTER — Ambulatory Visit: Payer: Medicare Other

## 2013-01-02 ENCOUNTER — Ambulatory Visit
Admission: RE | Admit: 2013-01-02 | Discharge: 2013-01-02 | Disposition: A | Discharge status: Home / Self Care | Payer: Medicare PPO | Source: Ambulatory Visit | Attending: Gynecology | Admitting: Gynecology

## 2013-01-02 DIAGNOSIS — Z1231 Encounter for screening mammogram for malignant neoplasm of breast: Secondary | ICD-10-CM

## 2013-01-04 ENCOUNTER — Other Ambulatory Visit: Payer: Self-pay | Admitting: Gynecology

## 2013-01-30 ENCOUNTER — Encounter: Payer: Medicare Other | Admitting: Internal Medicine

## 2013-02-08 ENCOUNTER — Ambulatory Visit (INDEPENDENT_AMBULATORY_CARE_PROVIDER_SITE_OTHER): Payer: Medicare PPO | Admitting: Internal Medicine

## 2013-02-08 ENCOUNTER — Encounter: Payer: Self-pay | Admitting: Internal Medicine

## 2013-02-08 VITALS — BP 124/84 | HR 76 | Temp 97.3°F | Ht 62.5 in | Wt 133.0 lb

## 2013-02-08 DIAGNOSIS — E785 Hyperlipidemia, unspecified: Secondary | ICD-10-CM

## 2013-02-08 DIAGNOSIS — Z0000 Encounter for general adult medical examination without abnormal findings: Secondary | ICD-10-CM

## 2013-02-08 DIAGNOSIS — R319 Hematuria, unspecified: Secondary | ICD-10-CM

## 2013-02-08 LAB — BASIC METABOLIC PANEL
BUN: 20 mg/dL (ref 6–23)
Chloride: 103 mEq/L (ref 96–112)
GFR: 77.89 mL/min (ref >60.00)
Potassium: 3.8 mEq/L (ref 3.5–5.1)

## 2013-02-08 LAB — CBC WITH DIFFERENTIAL/PLATELET
Basophils Absolute: 0 10*3/uL (ref 0.0–0.1)
Basophils Relative: 0.4 % (ref 0.0–3.0)
Eosinophils Absolute: 0.2 10*3/uL (ref 0.0–0.7)
Lymphocytes Relative: 35.2 % (ref 12.0–46.0)
MCHC: 34.1 g/dL (ref 30.0–36.0)
MCV: 90.6 fl (ref 78.0–100.0)
Monocytes Absolute: 0.4 10*3/uL (ref 0.1–1.0)
Neutrophils Relative %: 54.6 % (ref 43.0–77.0)
Platelets: 259 10*3/uL (ref 150.0–400.0)
RBC: 4.69 Mil/uL (ref 3.87–5.11)
RDW: 13.9 % (ref 11.5–14.6)

## 2013-02-08 LAB — LIPID PANEL
Cholesterol: 199 mg/dL (ref 0–200)
HDL: 53.9 mg/dL (ref >39.00)
LDL Cholesterol: 119 mg/dL — ABNORMAL HIGH (ref 0–99)
Total CHOL/HDL Ratio: 4
Triglycerides: 132 mg/dL (ref 0.0–149.0)
VLDL: 26.4 mg/dL (ref 0.0–40.0)

## 2013-02-08 NOTE — Patient Instructions (Signed)
Schedule dexa 

## 2013-02-08 NOTE — Progress Notes (Signed)
Subjective:    Rhonda [name] is a 67 y.o. female who presents for Medicare Annual/Subsequent preventive examination.  Preventive Screening-Counseling & Management  Tobacco History  Smoking status  . Former Smoker  Smokeless tobacco  . Not on file     She has some aches and pains-- sees [facility] for knee pain Occasional epistaxis   Current Problems (verified) Patient Active Problem List  Diagnosis  . HYPERLIPIDEMIA  . HEMATURIA  . OA (osteoarthritis) of knee    Medications Prior to Visit  Current Medications (verified) Current Outpatient Prescriptions  Medication Sig Dispense Refill  . calcium carbonate (OS-CAL) 600 MG TABS Take 600 mg by mouth 2 (two) times daily with a meal.        . cetirizine (ZYRTEC) 10 MG tablet Take 10 mg by mouth daily.        . Chlorpheniramine Maleate CR 12 MG CPCR Take 121 mg by mouth daily.        . Magnesium 250 MG TABS Take 1 tablet by mouth daily.        . metroNIDAZOLE (METROGEL) 1 % gel Apply topically daily.        . mometasone (NASONEX) 50 MCG/ACT nasal spray Place 2 sprays into the nose daily.  17 g  2  . progesterone (PROMETRIUM) 200 MG capsule Take 200 mg by mouth as directed.       . vitamin E 200 UNIT capsule Take 200 Units by mouth daily.        Marland Kitchen VIVELLE-DOT 0.05 MG/24HR Take 1 tablet by mouth 2 (two) times a week.       No current facility-administered medications for this visit.     Allergies (verified) Review of patient's allergies indicates no known allergies.   PAST HISTORY  Family History Family History  Problem Relation Age of Onset  . Cancer Father     unknown    Social History History  Substance Use Topics  . Smoking status: Former Games developer  . Smokeless tobacco: Not on file  . Alcohol Use: Yes     Are there smokers in your home (other than you)? No  Risk Factors Current exercise habits: water aerobics, not exercising Dietary issues discussed: not necessary  Cardiac risk factors:  age  Depression Screen (Note: if answer to either of the following is "Yes", a more complete depression screening is indicated)   Over the past two weeks, have you felt down, depressed or hopeless? No    Activities of Daily Living In your present state of health, do you have any difficulty performing the following activities?:  Driving? No Managing money?  No Feeding yourself? No Getting from bed to chair? No     Hearing Difficulties: No Do you often ask people to speak up or repeat themselves? No   Cognitive Testing  Alert? yes  Normal Appearance?Yes   List the Names of Other Physician/Practitioners you currently use: 1.    Indicate any recent Medical Services you may have received from other than [facility] providers in the past year (date may be approximate).  Immunization History  Administered Date(s) Administered  . Influenza Split 10/04/2011, 09/12/2012  . Influenza Whole 10/22/2009, 09/03/2010  . Pneumococcal Polysaccharide 10/22/2009  . Td 09/25/2008    Screening Tests Health Maintenance  Topic Date Due  . Influenza Vaccine  07/30/2013  . Mammogram  01/02/2015  . Tetanus/tdap  09/25/2018  . Colonoscopy  09/29/2021  . Pneumococcal Polysaccharide Vaccine Age 68 And Over  Completed  . Zostavax  Completed    All answers were reviewed with the patient and necessary referrals were made:  Judie Petit, MD   02/08/2013   History reviewed: allergies, current medications, past family history, past medical history, past social history, past surgical history and problem list  Review of Systems   patient denies chest pain, shortness of breath, orthopnea. Denies lower extremity edema, abdominal pain, change in appetite, change in bowel movements. Patient denies rashes, musculoskeletal complaints. No other specific complaints in a complete review of systems.    Objective:       Body mass index is 23.92 kg/(m^2). BP 124/84  Pulse 76  Temp(Src) 97.3 F (36.3 C)  (Oral)  Ht 5' 2.5" (1.588 m)  Wt 133 lb (60.328 kg)  BMI 23.92 kg/m2   Well-developed well-nourished female in no acute distress. HEENT exam atraumatic, normocephalic, extraocular muscles are intact. Neck is supple. No jugular venous distention no thyromegaly. Chest clear to auscultation without increased work of breathing. Cardiac exam S1 and S2 are regular. Abdominal exam active bowel sounds, soft, nontender. Extremities no edema. Neurologic exam she is alert without any motor sensory deficits. Gait is normal.      Assessment:     Well visit      Plan:  Health maint UTD   During the course of the visit the patient was educated and counseled about appropriate screening and preventive services including:    Pneumococcal vaccine   Influenza vaccine  Td vaccine  Screening mammography  Screening Pap smear and pelvic exam   Bone densitometry screening  Colorectal cancer screening  Diet review for nutrition referral? Yes ____  Not Indicated _x___   Patient Instructions (the written plan) was given to the patient.  Medicare Attestation I have personally reviewed: The patient's medical and social history Their use of alcohol, tobacco or illicit drugs Their current medications and supplements The patient's functional ability including ADLs,fall risks, home safety risks, cognitive, and hearing and visual impairment Diet and physical activities Evidence for depression or mood disorders  The patient's weight, height, BMI, have been recorded in the chart.  I have made referrals, counseling, and provided education to the patient based on review of the above and I have provided the patient with a written personalized care plan for preventive services.     Judie Petit, MD   02/08/2013

## 2013-02-09 ENCOUNTER — Other Ambulatory Visit: Payer: Self-pay | Admitting: Internal Medicine

## 2013-02-09 ENCOUNTER — Ambulatory Visit (INDEPENDENT_AMBULATORY_CARE_PROVIDER_SITE_OTHER)
Admission: RE | Admit: 2013-02-09 | Discharge: 2013-02-09 | Disposition: A | Discharge status: Home / Self Care | Payer: Medicare PPO | Source: Ambulatory Visit | Attending: Internal Medicine | Admitting: Internal Medicine

## 2013-02-09 DIAGNOSIS — M8580 Other specified disorders of bone density and structure, unspecified site: Secondary | ICD-10-CM

## 2013-02-09 DIAGNOSIS — M899 Disorder of bone, unspecified: Secondary | ICD-10-CM

## 2013-03-10 ENCOUNTER — Encounter: Payer: Self-pay | Admitting: Internal Medicine

## 2013-04-03 ENCOUNTER — Encounter: Payer: Self-pay | Admitting: Family Medicine

## 2013-04-03 ENCOUNTER — Ambulatory Visit (INDEPENDENT_AMBULATORY_CARE_PROVIDER_SITE_OTHER): Payer: Medicare PPO | Admitting: Family Medicine

## 2013-04-03 VITALS — BP 108/70 | Temp 98.0°F | Wt 135.0 lb

## 2013-04-03 DIAGNOSIS — R300 Dysuria: Secondary | ICD-10-CM

## 2013-04-03 LAB — POCT URINALYSIS DIPSTICK
Bilirubin, UA: NEGATIVE
Nitrite, UA: NEGATIVE
Protein, UA: NEGATIVE
pH, UA: 5.5

## 2013-04-03 MED ORDER — NITROFURANTOIN MONOHYD MACRO 100 MG PO CAPS: 100.0000 mg | ORAL_CAPSULE | Freq: Two times a day (BID) | ORAL | Status: DC

## 2013-04-03 NOTE — Patient Instructions (Addendum)
-  plenty of fluids and cranberry juice   -take antibiotic as instructed -As we discussed, we have prescribed a new medication for you at this appointment. We discussed the common and serious potential adverse effects of this medication and you can review these and more with the pharmacist when you pick up your medication.  Please follow the instructions for use carefully and notify us immediately if you have any problems taking this medication.  -We have ordered labs or studies at this visit. It can take up to 1-2 weeks for results and processing. We will contact you with instructions IF your results are abnormal. Normal results will be released to your [facility] If you have not heard from [place] or can not find your results in [facility] in 2 weeks please contact our office.

## 2013-04-03 NOTE — Addendum Note (Signed)
Addended by: Azucena Freed on: 04/03/2013 02:10 PM   Modules accepted: Orders

## 2013-04-03 NOTE — Progress Notes (Addendum)
Chief Complaint  Patient presents with  . Dysuria    cramping, lower back pain     HPI:  Acute visit for dysuria: -started: a few days ago -symptoms: urinary frequency and urgency and bladder spasm/cramping, low back pain this morning now gone -denies: fevers, chills, NVD, flank pain -hx of UTI in the past - last one was about 1 year ago   ROS: See pertinent positives and negatives per HPI.  Past Medical History  Diagnosis Date  . Rosacea   . Allergy   . Hyperlipidemia   . Neck pain, chronic   . RBBB (right bundle branch block with left anterior fascicular block)     Family History  Problem Relation Age of Onset  . Cancer Father     unknown    History   Social History  . Marital Status: Married    Spouse Name: N/A    Number of Children: N/A  . Years of Education: N/A   Social History Main Topics  . Smoking status: Former Games developer  . Smokeless tobacco: None  . Alcohol Use: Yes  . Drug Use:   . Sexually Active:    Other Topics Concern  . None   Social History Narrative  . None    Current outpatient prescriptions:calcium carbonate (OS-CAL) 600 MG TABS, Take 600 mg by mouth 2 (two) times daily with a meal.  , Disp: , Rfl: ;  cetirizine (ZYRTEC) 10 MG tablet, Take 10 mg by mouth daily.  , Disp: , Rfl: ;  Chlorpheniramine Maleate CR 12 MG CPCR, Take 121 mg by mouth daily.  , Disp: , Rfl: ;  Magnesium 250 MG TABS, Take 1 tablet by mouth daily.  , Disp: , Rfl:  metroNIDAZOLE (METROGEL) 1 % gel, Apply topically daily.  , Disp: , Rfl: ;  progesterone (PROMETRIUM) 200 MG capsule, Take 200 mg by mouth as directed. , Disp: , Rfl: ;  VIVELLE-DOT 0.05 MG/24HR, Take 1 tablet by mouth 2 (two) times a week., Disp: , Rfl: ;  mometasone (NASONEX) 50 MCG/ACT nasal spray, Place 2 sprays into the nose daily., Disp: 17 g, Rfl: 2 nitrofurantoin, macrocrystal-monohydrate, (MACROBID) 100 MG capsule, Take 1 capsule (100 mg total) by mouth 2 (two) times daily., Disp: 14 capsule, Rfl:  0  EXAM:  Filed Vitals:   04/03/13 1335  BP: 108/70  Temp: 98 F (36.7 C)    Body mass index is 24.28 kg/(m^2).  GENERAL: vitals reviewed and listed above, alert, oriented, appears well hydrated and in no acute distress  HEENT: atraumatic, conjunttiva clear, no obvious abnormalities on inspection of external nose and ears  NECK: no obvious masses on inspection  LUNGS: clear to auscultation bilaterally, no wheezes, rales or rhonchi, good air movement  CV: HRRR, no peripheral edema  ABD: soft, NTTP, BS +, no CVA TTP  MS: moves all extremities without noticeable abnormality  PSYCH: pleasant and cooperative, no obvious depression or anxiety  ASSESSMENT AND PLAN:  Discussed the following assessment and plan:  Dysuria - Plan: POCT urinalysis dipstick, Culture, Urine, Urine Microscopic Only, nitrofurantoin, macrocrystal-monohydrate, (MACROBID) 100 MG capsule  -urine dip abn - tx empirically with abx, culture pending -urine micro pending given 2+ blood on dip - if hematuria confirmed w/out infection she will follow up with PCP in one month to recheck  -  POC Blood Glu postprandial ok -culture pending  -Patient advised to return or notify a doctor immediately if symptoms worsen or persist or new concerns arise.  Patient Instructions  -  plenty of fluids and cranberry juice   -take antibiotic as instructed -As we discussed, we have prescribed a new medication for you at this appointment. We discussed the common and serious potential adverse effects of this medication and you can review these and more with the pharmacist when you pick up your medication.  Please follow the instructions for use carefully and notify us immediately if you have any problems taking this medication.  -We have ordered labs or studies at this visit. It can take up to 1-2 weeks for results and processing. We will contact you with instructions IF your results are abnormal. Normal results will be released to  your [facility]. If you have not heard from [place] or can not find your results in [facility] in 2 weeks please contact our office.            Kriste Basque R.

## 2013-04-04 LAB — URINALYSIS, MICROSCOPIC ONLY: Bacteria, UA: NONE SEEN

## 2013-04-04 LAB — URINE CULTURE: Colony Count: NO GROWTH

## 2013-04-05 ENCOUNTER — Telehealth: Payer: Self-pay | Admitting: Family Medicine

## 2013-04-05 NOTE — Telephone Encounter (Signed)
No uti on culture so far. If persistent symptoms should see her doctor.

## 2013-04-06 NOTE — Telephone Encounter (Signed)
Called and spoke with pt and pt is aware.  

## 2013-09-05 ENCOUNTER — Ambulatory Visit (INDEPENDENT_AMBULATORY_CARE_PROVIDER_SITE_OTHER): Payer: Medicare PPO

## 2013-09-05 DIAGNOSIS — Z23 Encounter for immunization: Secondary | ICD-10-CM

## 2013-10-18 DIAGNOSIS — H26492 Other secondary cataract, left eye: Secondary | ICD-10-CM | POA: Insufficient documentation

## 2013-12-28 ENCOUNTER — Other Ambulatory Visit: Payer: Self-pay

## 2013-12-28 DIAGNOSIS — Z1231 Encounter for screening mammogram for malignant neoplasm of breast: Secondary | ICD-10-CM

## 2014-01-06 ENCOUNTER — Ambulatory Visit (INDEPENDENT_AMBULATORY_CARE_PROVIDER_SITE_OTHER): Payer: Medicare PPO | Admitting: Internal Medicine

## 2014-01-06 VITALS — BP 138/84 | HR 108 | Temp 98.5°F | Resp 18 | Ht 61.25 in | Wt 135.8 lb

## 2014-01-06 DIAGNOSIS — R509 Fever, unspecified: Secondary | ICD-10-CM

## 2014-01-06 DIAGNOSIS — R05 Cough: Secondary | ICD-10-CM

## 2014-01-06 DIAGNOSIS — R059 Cough, unspecified: Secondary | ICD-10-CM

## 2014-01-06 LAB — POCT CBC
Granulocyte percent: 61.3 % (ref 37–80)
HCT, POC: 44 % (ref 37.7–47.9)
Hemoglobin: 13.6 g/dL (ref 12.2–16.2)
Lymph, poc: 1.6 (ref 0.6–3.4)
MCH, POC: 30.1 pg (ref 27–31.2)
MCHC: 30.9 g/dL — AB (ref 31.8–35.4)
MCV: 97.4 fL — AB (ref 80–97)
MID (cbc): 0.4 (ref 0–0.9)
MPV: 8.8 fL (ref 0–99.8)
POC Granulocyte: 3.2 (ref 2–6.9)
POC LYMPH PERCENT: 30.8 % (ref 10–50)
POC MID %: 7.9 % (ref 0–12)
Platelet Count, POC: 225 K/uL (ref 142–424)
RBC: 4.52 M/uL (ref 4.04–5.48)
RDW, POC: 14.7 %
WBC: 5.3 K/uL (ref 4.6–10.2)

## 2014-01-06 MED ORDER — HYDROCODONE-HOMATROPINE 5-1.5 MG/5ML PO SYRP: 5.0000 mL | ORAL_SOLUTION | Freq: Four times a day (QID) | ORAL | Status: DC | PRN

## 2014-01-06 NOTE — Progress Notes (Addendum)
Subjective:   This chart was scribed for Tami Lin, MD by Forrestine Him, [facility] Scribe. This patient was seen in room 3 and the patient's care was started 5:18 PM.    Patient ID: Rhonda [name], female    DOB: 12-18-1945, 68 y.o.   MRN: [id number]  HPI  HPI Comments: Rhonda [name] is a 68 y.o. female who presents to [facility] complaining of a resolving, constant sore throat that initially started 2 days ago. She also reports HA, chills, a mildly productive cough consisting of yellow sputum that keeps her from sleep at night, otalgia, postnasal drip, chest tightness, and fatigue onset 2 days. She states she feels she is having "increase trouble hearing" she associates with her ear discomfort. She states she is able to consume food and drink without difficulty, but admits to a decrease in appetite and consumption. At this time she denies dysphagia, SOB or fever.  Review of Systems  Constitutional: Positive for chills and fatigue. Negative for fever.  HENT: Positive for congestion, ear pain and postnasal drip.   Respiratory: Positive for cough and chest tightness. Negative for shortness of breath.     Past Medical History  Diagnosis Date  . Rosacea   . Allergy   . Hyperlipidemia   . Neck pain, chronic   . RBBB (right bundle branch block with left anterior fascicular block)     Past Surgical History  Procedure Laterality Date  . Tubal ligation    . Tonsillectomy    . Bcc excision    . Hysteroscopy    . Cholecystectomy      Triage Vitals: BP 138/84  Pulse 108  Temp(Src) 98.5 F (36.9 C) (Oral)  Resp 18  Ht 5' 1.25" (1.556 m)  Wt 135 lb 12.8 oz (61.598 kg)  BMI 25.44 kg/m2  SpO2 96%   Objective:  Physical Exam  Nursing note and vitals reviewed. Constitutional: She is oriented to person, place, and time. She appears well-developed and well-nourished.  HENT:  Head: Normocephalic and atraumatic.  Right Ear: Tympanic  membrane, external ear and ear canal normal.  Left Ear: Tympanic membrane, external ear and ear canal normal.  Mouth/Throat: Posterior oropharyngeal erythema present. No oropharyngeal exudate or posterior oropharyngeal edema.  Hearing aids removed for exam  Eyes: EOM are normal.  Neck: Normal range of motion. No thyromegaly present.  Cardiovascular: Normal rate, regular rhythm and normal heart sounds.  Exam reveals no gallop and no friction rub.   No murmur heard. Pulmonary/Chest: Effort normal. She has no wheezes.  Mild rhonchi   Musculoskeletal: Normal range of motion.  Lymphadenopathy:    She has no cervical adenopathy.  Neurological: She is alert and oriented to person, place, and time.  Skin: Skin is warm and dry.  Psychiatric: She has a normal mood and affect. Her behavior is normal.   Results for orders placed in visit on 01/06/14  POCT CBC      Result Value Range   WBC 5.3  4.6 - 10.2 K/uL   Lymph, poc 1.6  0.6 - 3.4   POC LYMPH PERCENT 30.8  10 - 50 %L   MID (cbc) 0.4  0 - 0.9   POC MID % 7.9  0 - 12 %M   POC Granulocyte 3.2  2 - 6.9   Granulocyte percent 61.3  37 - 80 %G   RBC 4.52  4.04 - 5.48 M/uL   Hemoglobin 13.6  12.2 -  16.2 g/dL   HCT, POC 44.0  37.7 - 47.9 %   MCV 97.4 (*) 80 - 97 fL   MCH, POC 30.1  27 - 31.2 pg   MCHC 30.9 (*) 31.8 - 35.4 g/dL   RDW, POC 14.7     Platelet Count, POC 225  142 - 424 K/uL   MPV 8.8  0 - 99.8 fL    Assessment & Plan:  I have completed the patient encounter in its entirety as documented by the scribe, with editing by me where necessary. Davione Lenker P. Laney Pastor, M.D. Cough - Plan: POCT CBC  Fever, unspecified - Plan: POCT CBC  Meds ordered this encounter  Medications  . HYDROcodone-homatropine (HYCODAN) 5-1.5 MG/5ML syrup    Sig: Take 5 mLs by mouth every 6 (six) hours as needed for cough.    Dispense:  120 mL    Refill:  0   OTC meds as needed Watch for symptoms of pneumonia over the next several days and return at  once

## 2014-01-16 ENCOUNTER — Ambulatory Visit: Payer: Medicare PPO

## 2014-01-23 ENCOUNTER — Ambulatory Visit: Payer: Medicare PPO

## 2014-01-29 ENCOUNTER — Encounter: Payer: Self-pay | Admitting: Family

## 2014-01-29 ENCOUNTER — Ambulatory Visit (INDEPENDENT_AMBULATORY_CARE_PROVIDER_SITE_OTHER): Payer: Medicare PPO | Admitting: Family

## 2014-01-29 VITALS — BP 100/70 | HR 87 | Temp 97.3°F | Ht 61.25 in | Wt 133.0 lb

## 2014-01-29 DIAGNOSIS — H9209 Otalgia, unspecified ear: Secondary | ICD-10-CM

## 2014-01-29 DIAGNOSIS — J019 Acute sinusitis, unspecified: Secondary | ICD-10-CM

## 2014-01-29 MED ORDER — AMOXICILLIN-POT CLAVULANATE 875-125 MG PO TABS: 1.0000 | ORAL_TABLET | Freq: Two times a day (BID) | ORAL | Status: DC

## 2014-01-29 MED ORDER — FLUTICASONE PROPIONATE 50 MCG/ACT NA SUSP: 2.0000 | Freq: Every day | NASAL | Status: DC

## 2014-01-29 NOTE — Progress Notes (Signed)
Pre visit review using our clinic review tool, if applicable. No additional management support is needed unless otherwise documented below in the visit note. 

## 2014-01-29 NOTE — Patient Instructions (Signed)

## 2014-01-29 NOTE — Progress Notes (Signed)
Subjective:    Patient ID: Rhonda [name], female    DOB: 01/17/1946, 68 y.o.   MRN: [id number]  HPI  68 year old white female, nonsmoker is in today with complaints of fluid throat, headache, pain in her teeth ongoing since 01/06/2014 she was seen at urgent care and was prescribed a cough syrup to help her symptoms but the symptoms returned. She has been taking Claritin-D, nasal saline, Advil without much relief.   Review of Systems  Constitutional: Negative.   HENT: Positive for congestion, postnasal drip, rhinorrhea and sinus pressure.   Respiratory: Negative.   Cardiovascular: Negative.   Musculoskeletal: Negative.   Skin: Negative.   Allergic/Immunologic: Negative.   Psychiatric/Behavioral: Negative.    Past Medical History  Diagnosis Date  . Rosacea   . Allergy   . Hyperlipidemia   . Neck pain, chronic   . RBBB (right bundle branch block with left anterior fascicular block)     History   Social History  . Marital Status: Married    Spouse Name: N/A    Number of Children: N/A  . Years of Education: N/A   Occupational History  . Not on file.   Social History Main Topics  . Smoking status: Former Research scientist (life sciences)  . Smokeless tobacco: Not on file  . Alcohol Use: Yes  . Drug Use:   . Sexual Activity:    Other Topics Concern  . Not on file   Social History Narrative  . No narrative on file    Past Surgical History  Procedure Laterality Date  . Tubal ligation    . Tonsillectomy    . Bcc excision    . Hysteroscopy    . Cholecystectomy      Family History  Problem Relation Age of Onset  . Cancer Father     unknown    No Known Allergies  Current Outpatient Prescriptions on File Prior to Visit  Medication Sig Dispense Refill  . VIVELLE-DOT 0.05 MG/24HR Take 1 tablet by mouth 2 (two) times a week.      . calcium carbonate (OS-CAL) 600 MG TABS Take 600 mg by mouth 2 (two) times daily with a meal.        . cetirizine (ZYRTEC) 10 MG tablet Take 10 mg by mouth  daily.        . Chlorpheniramine Maleate CR 12 MG CPCR Take 121 mg by mouth daily.        Marland Kitchen HYDROcodone-homatropine (HYCODAN) 5-1.5 MG/5ML syrup Take 5 mLs by mouth every 6 (six) hours as needed for cough.  120 mL  0  . Magnesium 250 MG TABS Take 1 tablet by mouth daily.        . progesterone (PROMETRIUM) 200 MG capsule Take 200 mg by mouth as directed.        No current facility-administered medications on file prior to visit.    BP 100/70  Pulse 87  Temp(Src) 97.3 F (36.3 C) (Oral)  Ht 5' 1.25" (1.556 m)  Wt 133 lb (60.328 kg)  BMI 24.92 kg/m2chart    Objective:   Physical Exam  Constitutional: She is oriented to person, place, and time. She appears well-developed and well-nourished.  HENT:  Right Ear: External ear normal.  Left Ear: External ear normal.  Nose: Nose normal.  Mouth/Throat: Oropharynx is clear and moist.  Neck: Normal range of motion. Neck supple.  Cardiovascular: Normal rate and normal heart sounds.   Pulmonary/Chest: Effort normal and breath sounds normal.  Neurological: She is alert  and oriented to person, place, and time.  Skin: Skin is warm and dry.  Psychiatric: She has a normal mood and affect.          Assessment & Plan:  Rhonda [name] was seen today for sinusitis.  Diagnoses and associated orders for this visit:  Acute sinusitis  Otalgia  Other Orders - amoxicillin-clavulanate (AUGMENTIN) 875-125 MG per tablet; Take 1 tablet by mouth 2 (two) times daily. - fluticasone (FLONASE) 50 MCG/ACT nasal spray; Place 2 sprays into both nostrils daily.   Call with any questions or concerns. Recheck as scheduled and  As needed

## 2014-02-11 ENCOUNTER — Encounter: Payer: Medicare PPO | Admitting: Internal Medicine

## 2014-04-01 ENCOUNTER — Encounter: Payer: Medicare PPO | Admitting: Internal Medicine

## 2014-04-15 ENCOUNTER — Ambulatory Visit (INDEPENDENT_AMBULATORY_CARE_PROVIDER_SITE_OTHER): Payer: Medicare PPO | Admitting: Internal Medicine

## 2014-04-15 ENCOUNTER — Encounter: Payer: Self-pay | Admitting: Internal Medicine

## 2014-04-15 VITALS — BP 102/68 | HR 63 | Temp 97.9°F | Ht 62.5 in | Wt 129.0 lb

## 2014-04-15 DIAGNOSIS — E785 Hyperlipidemia, unspecified: Secondary | ICD-10-CM

## 2014-04-15 DIAGNOSIS — Z0000 Encounter for general adult medical examination without abnormal findings: Secondary | ICD-10-CM

## 2014-04-15 LAB — CBC WITH DIFFERENTIAL/PLATELET
BASOS PCT: 0.4 % (ref 0.0–3.0)
Basophils Absolute: 0 10*3/uL (ref 0.0–0.1)
EOS ABS: 0.2 10*3/uL (ref 0.0–0.7)
EOS PCT: 3.3 % (ref 0.0–5.0)
HEMATOCRIT: 40.5 % (ref 36.0–46.0)
HEMOGLOBIN: 13.8 g/dL (ref 12.0–15.0)
Lymphocytes Relative: 35.8 % (ref 12.0–46.0)
Lymphs Abs: 2.1 10*3/uL (ref 0.7–4.0)
MCHC: 34.1 g/dL (ref 30.0–36.0)
MCV: 88.1 fl (ref 78.0–100.0)
MONO ABS: 0.3 10*3/uL (ref 0.1–1.0)
Monocytes Relative: 5.4 % (ref 3.0–12.0)
NEUTROS ABS: 3.2 10*3/uL (ref 1.4–7.7)
Neutrophils Relative %: 55.1 % (ref 43.0–77.0)
Platelets: 252 10*3/uL (ref 150.0–400.0)
RBC: 4.59 Mil/uL (ref 3.87–5.11)
RDW: 14.1 % (ref 11.5–15.5)
WBC: 5.8 10*3/uL (ref 4.0–10.5)

## 2014-04-15 LAB — LIPID PANEL
CHOL/HDL RATIO: 4
Cholesterol: 184 mg/dL (ref 0–200)
HDL: 49.4 mg/dL (ref >39.00)
LDL Cholesterol: 119 mg/dL — ABNORMAL HIGH (ref 0–99)
TRIGLYCERIDES: 80 mg/dL (ref 0.0–149.0)
VLDL: 16 mg/dL (ref 0.0–40.0)

## 2014-04-15 LAB — POCT URINALYSIS DIPSTICK
BILIRUBIN UA: NEGATIVE
GLUCOSE UA: NEGATIVE
KETONES UA: NEGATIVE
NITRITE UA: NEGATIVE
PH UA: 5
Protein, UA: NEGATIVE
Spec Grav, UA: 1.025
Urobilinogen, UA: 0.2

## 2014-04-15 LAB — BASIC METABOLIC PANEL
BUN: 22 mg/dL (ref 6–23)
CHLORIDE: 107 meq/L (ref 96–112)
CO2: 28 meq/L (ref 19–32)
Calcium: 9.6 mg/dL (ref 8.4–10.5)
Creatinine, Ser: 0.8 mg/dL (ref 0.4–1.2)
GFR: 72.25 mL/min (ref >60.00)
Glucose, Bld: 89 mg/dL (ref 70–99)
POTASSIUM: 5 meq/L (ref 3.5–5.1)
SODIUM: 142 meq/L (ref 135–145)

## 2014-04-15 LAB — HEPATIC FUNCTION PANEL
ALBUMIN: 4.3 g/dL (ref 3.5–5.2)
ALK PHOS: 65 U/L (ref 39–117)
ALT: 18 U/L (ref 0–35)
AST: 20 U/L (ref 0–37)
BILIRUBIN DIRECT: 0.1 mg/dL (ref 0.0–0.3)
TOTAL PROTEIN: 7.4 g/dL (ref 6.0–8.3)
Total Bilirubin: 0.9 mg/dL (ref 0.2–1.2)

## 2014-04-15 LAB — TSH: TSH: 2.99 u[IU]/mL (ref 0.35–4.50)

## 2014-04-15 NOTE — Progress Notes (Signed)
Well visit-   Past Medical History  Diagnosis Date  . Rosacea   . Allergy   . Hyperlipidemia   . Neck pain, chronic   . RBBB (right bundle branch block with left anterior fascicular block)     History   Social History  . Marital Status: Married    Spouse Name: N/A    Number of Children: N/A  . Years of Education: N/A   Occupational History  . Not on file.   Social History Main Topics  . Smoking status: Former Research scientist (life sciences)  . Smokeless tobacco: Not on file  . Alcohol Use: Yes  . Drug Use:   . Sexual Activity:    Other Topics Concern  . Not on file   Social History Narrative  . No narrative on file    Past Surgical History  Procedure Laterality Date  . Tubal ligation    . Tonsillectomy    . Bcc excision    . Hysteroscopy    . Cholecystectomy      Family History  Problem Relation Age of Onset  . Cancer Father     unknown    No Known Allergies  Current Outpatient Prescriptions on File Prior to Visit  Medication Sig Dispense Refill  . calcium carbonate (OS-CAL) 600 MG TABS Take 600 mg by mouth 2 (two) times daily with a meal.        . cetirizine (ZYRTEC) 10 MG tablet Take 10 mg by mouth daily.        . Chlorpheniramine Maleate CR 12 MG CPCR Take 121 mg by mouth daily.        . fluticasone (FLONASE) 50 MCG/ACT nasal spray Place 2 sprays into both nostrils daily.  16 g  6  . Magnesium 250 MG TABS Take 1 tablet by mouth daily.         No current facility-administered medications on file prior to visit.     patient denies chest pain, shortness of breath, orthopnea. Denies lower extremity edema, abdominal pain, change in appetite, change in bowel movements. Patient denies rashes, musculoskeletal complaints. No other specific complaints in a complete review of systems.   BP 102/68  Pulse 63  Temp(Src) 97.9 F (36.6 C) (Oral)  Ht 5' 2.5" (1.588 m)  Wt 129 lb (58.514 kg)  BMI 23.20 kg/m2  SpO2 99%  Well-developed well-nourished female in no acute distress. HEENT  exam atraumatic, normocephalic, extraocular muscles are intact. Neck is supple. No jugular venous distention no thyromegaly. Chest clear to auscultation without increased work of breathing. Cardiac exam S1 and S2 are regular. Abdominal exam active bowel sounds, soft, nontender. Extremities no edema. Neurologic exam she is alert without any motor sensory deficits. Gait is normal.  Well visit- health maint utd  DEXA in one year now that she is off HRT

## 2014-04-15 NOTE — Progress Notes (Signed)
Pre visit review using our clinic review tool, if applicable. No additional management support is needed unless otherwise documented below in the visit note. 

## 2014-07-30 ENCOUNTER — Telehealth: Payer: Self-pay | Admitting: Internal Medicine

## 2014-07-30 NOTE — Telephone Encounter (Signed)
Lm with spouse to call and est with [facility]

## 2014-08-01 NOTE — Telephone Encounter (Signed)
Pt has been sch for 11/15/14

## 2014-09-06 ENCOUNTER — Telehealth (INDEPENDENT_AMBULATORY_CARE_PROVIDER_SITE_OTHER): Payer: Medicare PPO

## 2014-09-06 ENCOUNTER — Ambulatory Visit (INDEPENDENT_AMBULATORY_CARE_PROVIDER_SITE_OTHER): Payer: Medicare PPO | Admitting: Family Medicine

## 2014-09-06 DIAGNOSIS — Z23 Encounter for immunization: Secondary | ICD-10-CM

## 2014-09-06 NOTE — Telephone Encounter (Signed)
error 

## 2014-09-16 ENCOUNTER — Other Ambulatory Visit: Payer: Self-pay | Admitting: Dermatology

## 2014-11-15 ENCOUNTER — Encounter: Payer: Self-pay | Admitting: Family Medicine

## 2014-11-15 ENCOUNTER — Ambulatory Visit (INDEPENDENT_AMBULATORY_CARE_PROVIDER_SITE_OTHER): Payer: Medicare PPO | Admitting: Family Medicine

## 2014-11-15 VITALS — BP 100/70 | HR 82

## 2014-11-15 DIAGNOSIS — M25511 Pain in right shoulder: Secondary | ICD-10-CM

## 2014-11-15 DIAGNOSIS — N182 Chronic kidney disease, stage 2 (mild): Secondary | ICD-10-CM | POA: Insufficient documentation

## 2014-11-15 DIAGNOSIS — J309 Allergic rhinitis, unspecified: Secondary | ICD-10-CM | POA: Insufficient documentation

## 2014-11-15 DIAGNOSIS — E785 Hyperlipidemia, unspecified: Secondary | ICD-10-CM

## 2014-11-15 DIAGNOSIS — Z85828 Personal history of other malignant neoplasm of skin: Secondary | ICD-10-CM | POA: Insufficient documentation

## 2014-11-15 DIAGNOSIS — M25512 Pain in left shoulder: Secondary | ICD-10-CM | POA: Insufficient documentation

## 2014-11-15 DIAGNOSIS — M8580 Other specified disorders of bone density and structure, unspecified site: Secondary | ICD-10-CM

## 2014-11-15 DIAGNOSIS — M810 Age-related osteoporosis without current pathological fracture: Secondary | ICD-10-CM | POA: Insufficient documentation

## 2014-11-15 DIAGNOSIS — L719 Rosacea, unspecified: Secondary | ICD-10-CM | POA: Insufficient documentation

## 2014-11-15 MED ORDER — PREDNISONE 20 MG PO TABS: 20.0000 mg | ORAL_TABLET | Freq: Every day | ORAL | Status: DC

## 2014-11-15 NOTE — Assessment & Plan Note (Addendum)
History of neck pain. Concern for cervical arthritis. On exam, does have some pain over [facility] joint which could certainly be contributing. Already on NSAIDs taking a few doses at night so we will treat with 7 days of prednisone. Also warned of CKD stage II which we should monitor at least once a year with her nsaid use. Nonexertional and highly doubt cardiac even though L>R pain. No stiffness to be concerned about polymyalgia rheumatica.

## 2014-11-15 NOTE — Patient Instructions (Addendum)
i think you may have some mild shoulder arthritis but I am a little more worried about neck arthritis given some radiation into the arms. You can continue your ibuprofen and we will try to get you out of your current funk with some prednisone for 7 days.  Could consider imaging of shoulders/neck and physical therapy.   Follow up with me 1-2 weeks after course if not significantly better (im off week after Christmas just so you know).   Otherwise, I think you are doing great!   Let's check in perhaps June regardless for a physical.

## 2014-11-15 NOTE — Assessment & Plan Note (Signed)
Continue calcium/vit D, repeat DEXA late 2017.

## 2014-11-15 NOTE — Progress Notes (Addendum)
Garret Reddish, MD Phone: [phone]  Subjective:  Patient presents today to establish care with me as their new primary care provider. Patient was formerly a patient of Dr. Leanne Chang. Chief complaint-noted.   Shoulder Pain- new issue History of neck pain-currently minor Occuring for several months to a year. Bilateral shoulder pain L>R. Takes 2 ibuprofen before bed. Sometimes wakes up and has to take another. Pain also in the morning when she wakes up. Used to think [employer] exercises aggravated it but has been off for 2 weeks and has continued. Worse over last 2-3 daysNo exertional component to pain. Pain not particularly worse with overhead activity-laying down is the biggest source of pain and persists into the AM. Within last year did have a fall at some point and had podiatrist take x-ray which was normal reportedly.   Water aerobics. At least twice a week for exercise   ROS- no shoulder stiffness, no fever/chills/weight loss.   Osteopenia- stable Diagnosed in 2014. Taking calcium-vit D.  ROS- no recent history of fractures or falls (see one listed above)  CKD stage II-informed of new diagnosis that has been stable but undocumented Stable over last few years.  History of hematuria with extensive urology workup and advise dno further follow up previously.  ROS- no changes in urination pattern.   Hyperlipidemia-mild poor control  Lab Results  Component Value Date   LDLCALC 119* 04/15/2014  On statin: no, fish oil alone Regular exercise: yes twice a week and previously 6x a week ROS- no chest pain or shortness of breath. No myalgias though does have arthralgias with OA  The following were reviewed and entered/updated in epic: Past Medical History  Diagnosis Date  . Rosacea     metrogel in past  . Allergy   . Hyperlipidemia   . Neck pain, chronic   . RBBB (right bundle branch block with left anterior fascicular block)    Patient Active Problem List   Diagnosis Date Noted  .  CKD (chronic kidney disease), stage II 11/15/2014    Priority: Medium  . Osteoarthritis 11/18/2011    Priority: Medium  . Hyperlipidemia 09/05/2006    Priority: Medium  . Osteopenia 11/15/2014    Priority: Low  . Allergic rhinitis 11/15/2014    Priority: Low  . History of basal cell cancer 11/15/2014    Priority: Low  . Rosacea     Priority: Low  . Hematuria 09/25/2008    Priority: Low  . Bilateral shoulder pain 11/15/2014   Past Surgical History  Procedure Laterality Date  . Tubal ligation    . Tonsillectomy    . Bcc excision      x3  . Hysteroscopy    . Cholecystectomy      Family History  Problem Relation Age of Onset  . Cancer Father     unknown  . Alzheimer's disease Mother     died of bowel rupture    Medications- reviewed and updated Current Outpatient Prescriptions  Medication Sig Dispense Refill  . calcium carbonate (OS-CAL) 600 MG TABS Take 600 mg by mouth 2 (two) times daily with a meal.      . cetirizine (ZYRTEC) 10 MG tablet Take 10 mg by mouth daily.      . Chlorpheniramine Maleate CR 12 MG CPCR Take 121 mg by mouth daily.      . Fish Oil-Cholecalciferol (FISH OIL + D3 PO) Take by mouth daily.    . fluticasone (FLONASE) 50 MCG/ACT nasal spray Place 2 sprays into  both nostrils daily. 16 g 6  . Magnesium 250 MG TABS Take 1 tablet by mouth daily.      . predniSONE (DELTASONE) 20 MG tablet Take 1 tablet (20 mg total) by mouth daily with breakfast. 7 tablet 0   No current facility-administered medications for this visit.    Allergies-reviewed and updated No Known Allergies  History   Social History  . Marital Status: Married    Spouse Name: N/A    Number of Children: N/A  . Years of Education: N/A   Social History Main Topics  . Smoking status: Former Smoker -- 0.75 packs/day for 10 years    Types: Cigarettes  . Smokeless tobacco: None     Comment: quit at age 81  . Alcohol Use: 0.0 oz/week    0 Not specified per week     Comment: once a  month  . Drug Use: No  . Sexual Activity: None   Other Topics Concern  . None   Social History Narrative   Married 1972. 2 children. No grandkids. 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     ROS--See HPI   Objective: BP 100/70 mmHg  Pulse 82 Gen: NAD, resting comfortably HEENT: Mucous membranes are moist. Oropharynx normal CV: RRR no murmurs rubs or gallops Lungs: CTAB no crackles, wheeze, rhonchi Abdomen: soft/nontender/nondistended/normal bowel sounds. No rebound or guarding.  Ext: no edema Skin: warm, dry, no rash Neuro: grossly normal, moves all extremities, PERRLA  Spurling negative  Shoulder: Inspection reveals no abnormalities, atrophy or asymmetry. Palpation reveals tenderness over AC joint but none over bicipital groove. ROM is full in all planes without painful arc. No drop arm sign.  Rotator cuff strength normal throughout. No signs of impingement with negative Neer and Hawkin's tests, empty can.  Assessment/Plan:  Bilateral shoulder pain History of neck pain. Concern for cervical arthritis. On exam, does have some pain over [facility] joint which could certainly be contributing. Already on NSAIDs taking a few doses at night so we will treat with 7 days of prednisone. Also warned of CKD stage II which we should monitor at least once a year with her nsaid use. Nonexertional and highly doubt cardiac even though L>R pain. No stiffness to be concerned about polymyalgia rheumatica.   Osteopenia Continue calcium/vit D, repeat DEXA late 2017.   CKD (chronic kidney disease), stage II Discussed mild age related changes to kidney. Monitor at least once yearly. Warned of nsaid risk but monitor for now and no transition off unless progresses to stage III  Hyperlipidemia Mild poor contorl on Fish oil alone. 5.9% 10 year risk. We discussed with her excellent health  overall, regular exercise, likely to use 10% 10 year risk threshold for starting statin.   Return precautions advised. 6 month physical or 3 week follow up if needed for shoulder pain.   Meds ordered this encounter  Medications  . predniSONE (DELTASONE) 20 MG tablet    Sig: Take 1 tablet (20 mg total) by mouth daily with breakfast.    Dispense:  7 tablet    Refill:  0

## 2014-11-15 NOTE — Assessment & Plan Note (Signed)
Discussed mild age related changes to kidney. Monitor at least once yearly. Warned of nsaid risk but monitor for now and no transition off unless progresses to stage III

## 2014-11-15 NOTE — Assessment & Plan Note (Signed)
Mild poor contorl on Fish oil alone. 5.9% 10 year risk. We discussed with her excellent health overall, regular exercise, likely to use 10% 10 year risk threshold for starting statin.

## 2014-12-04 DIAGNOSIS — H903 Sensorineural hearing loss, bilateral: Secondary | ICD-10-CM | POA: Diagnosis not present

## 2014-12-09 ENCOUNTER — Ambulatory Visit (INDEPENDENT_AMBULATORY_CARE_PROVIDER_SITE_OTHER): Payer: Medicare PPO | Admitting: Family Medicine

## 2014-12-09 ENCOUNTER — Encounter: Payer: Self-pay | Admitting: Family Medicine

## 2014-12-09 VITALS — BP 124/74 | HR 100 | Temp 97.6°F | Ht 62.5 in | Wt 134.0 lb

## 2014-12-09 DIAGNOSIS — M25512 Pain in left shoulder: Secondary | ICD-10-CM | POA: Diagnosis not present

## 2014-12-09 DIAGNOSIS — M25511 Pain in right shoulder: Secondary | ICD-10-CM

## 2014-12-09 MED ORDER — TRAMADOL HCL 50 MG PO TABS: 25.0000 mg | ORAL_TABLET | Freq: Two times a day (BID) | ORAL | Status: DC | PRN

## 2014-12-09 NOTE — Progress Notes (Signed)
Garret Reddish, MD Phone: [phone]  Subjective:   Rhonda [name] is a 69 y.o. year old very pleasant female patient who presents with the following:  Bilateral shoulder pain L >R.  At visit about 3 weeks ago, concern for cervical arthritis though did have some AC joint tenderness as well neither with resolution on NSAIDs. Pain improved for a few days while on and after prednisone then recurred and more severe. No stiffness. No pain down into right arm. Pain down into left forearm. 7-8/10 at its maximum. No pain with putting arms overhead. advil 2 at night is the only thing that helpsher sleep. History of neck pain and likely cervical arthritis.  ROS-No exertional component or shortness of breath. No fever/chills/weight loss.   Past Medical History- Patient Active Problem List   Diagnosis Date Noted  . CKD (chronic kidney disease), stage II 11/15/2014    Priority: Medium  . Osteoarthritis 11/18/2011    Priority: Medium  . Hyperlipidemia 09/05/2006    Priority: Medium  . Osteopenia 11/15/2014    Priority: Low  . Allergic rhinitis 11/15/2014    Priority: Low  . History of basal cell cancer 11/15/2014    Priority: Low  . Rosacea     Priority: Low  . Hematuria 09/25/2008    Priority: Low  . Bilateral shoulder pain 11/15/2014   Medications- reviewed and updated Current Outpatient Prescriptions  Medication Sig Dispense Refill  . calcium carbonate (OS-CAL) 600 MG TABS Take 600 mg by mouth 2 (two) times daily with a meal.      . cetirizine (ZYRTEC) 10 MG tablet Take 10 mg by mouth daily.      . Chlorpheniramine Maleate CR 12 MG CPCR Take 121 mg by mouth daily.      . Fish Oil-Cholecalciferol (FISH OIL + D3 PO) Take by mouth daily.    . fluticasone (FLONASE) 50 MCG/ACT nasal spray Place 2 sprays into both nostrils daily. 16 g 6  . Magnesium 250 MG TABS Take 1 tablet by mouth daily.       No current facility-administered medications for this visit.    Objective: BP 124/74  mmHg  Pulse 100  Temp(Src) 97.6 F (36.4 C) (Oral)  Ht 5' 2.5" (1.588 m)  Wt 134 lb (60.782 kg)  BMI 24.10 kg/m2 Gen: NAD, resting comfortably CV: RRR no murmurs rubs or gallops Lungs: CTAB no crackles, wheeze, rhonchi Ext: no edema Skin: warm, dry, no rash MSK: Spurling negative Neuro: 5/5 strength upper extremities at times limited by pain in neck/shoulder with 4/5 resisted abduction of arm but 4/5 on empty can.   Left Shoulder (right largely nromal): Inspection reveals no abnormalities, atrophy or asymmetry. Palpation reveals mild  tenderness over AC joint and in trapezius muscle but none over bicipital groove. ROM is full in all planes without painful arc. No drop arm sign.  No signs of impingement with negative Neer and Hawkin's tests, empty can.   Assessment/Plan:  Bilateral shoulder pain Pain improved on prednisone then worsened. Bilateral with pain radiating down to left forarm (no radiation on left). Concern for cervical arthritis and failed more conservative therapies. Mild AC joint tenderness and doubt a shoulder joint issue other than this. Refer to [place] where she has been seen before-to consider x-ray/MRI and potential interventions through Dr. Ernestina Patches. No stiffness or hip difficulties to suggest polymyalgia rheumatica.   Return precautions advised. Tramadol to be used prn for pain between now and ortho visit.   Orders Placed This Encounter  Procedures  . AMB referral to orthopedics    Referral Priority:  Routine    Referral Type:  Consultation    Number of Visits Requested:  1    Meds ordered this encounter  Medications  . traMADol (ULTRAM) 50 MG tablet    Sig: Take 0.5-1 tablets (25-50 mg total) by mouth every 12 (twelve) hours as needed for moderate pain or severe pain (Do not drive for at least 8 hours after dose.).    Dispense:  30 tablet    Refill:  0

## 2014-12-09 NOTE — Assessment & Plan Note (Addendum)
Pain improved on prednisone then worsened. Bilateral with pain radiating down to left forarm (no radiation on left). Concern for cervical arthritis and failed more conservative therapies. Mild AC joint tenderness and doubt a shoulder joint issue other than this. Refer to [place] where she has been seen before-to consider x-ray/MRI and potential interventions through Dr. Ernestina Patches. No stiffness or hip difficulties to suggest polymyalgia rheumatica.

## 2014-12-09 NOTE — Progress Notes (Signed)
Pre visit review using our clinic review tool, if applicable. No additional management support is needed unless otherwise documented below in the visit note. 

## 2014-12-09 NOTE — Patient Instructions (Addendum)
I continue to be worried about this pain coming from your neck especially given worsening arm pain down into your forearm. Trial tramadol to help with pain. Can still take aleve.   Instead of sending to physical therapy, we opted to send you to orthopedics given your worsening pain. They would perform further imaging such as x-ray or MRI if needed. They could consider injectoins.   Let's check in perhaps June/July regardless for a physical.

## 2014-12-11 DIAGNOSIS — M791 Myalgia: Secondary | ICD-10-CM | POA: Diagnosis not present

## 2014-12-11 DIAGNOSIS — M542 Cervicalgia: Secondary | ICD-10-CM | POA: Diagnosis not present

## 2014-12-11 DIAGNOSIS — M47812 Spondylosis without myelopathy or radiculopathy, cervical region: Secondary | ICD-10-CM | POA: Diagnosis not present

## 2014-12-11 DIAGNOSIS — M25512 Pain in left shoulder: Secondary | ICD-10-CM | POA: Diagnosis not present

## 2014-12-11 DIAGNOSIS — M25519 Pain in unspecified shoulder: Secondary | ICD-10-CM | POA: Diagnosis not present

## 2014-12-18 DIAGNOSIS — M542 Cervicalgia: Secondary | ICD-10-CM | POA: Diagnosis not present

## 2014-12-19 DIAGNOSIS — D3131 Benign neoplasm of right choroid: Secondary | ICD-10-CM | POA: Diagnosis not present

## 2014-12-19 DIAGNOSIS — H26492 Other secondary cataract, left eye: Secondary | ICD-10-CM | POA: Diagnosis not present

## 2014-12-19 DIAGNOSIS — H43813 Vitreous degeneration, bilateral: Secondary | ICD-10-CM | POA: Diagnosis not present

## 2014-12-19 DIAGNOSIS — Z961 Presence of intraocular lens: Secondary | ICD-10-CM | POA: Diagnosis not present

## 2014-12-25 DIAGNOSIS — M542 Cervicalgia: Secondary | ICD-10-CM | POA: Diagnosis not present

## 2014-12-27 DIAGNOSIS — M542 Cervicalgia: Secondary | ICD-10-CM | POA: Diagnosis not present

## 2015-01-01 DIAGNOSIS — M542 Cervicalgia: Secondary | ICD-10-CM | POA: Diagnosis not present

## 2015-01-03 DIAGNOSIS — M542 Cervicalgia: Secondary | ICD-10-CM | POA: Diagnosis not present

## 2015-01-06 DIAGNOSIS — M542 Cervicalgia: Secondary | ICD-10-CM | POA: Diagnosis not present

## 2015-01-07 DIAGNOSIS — M542 Cervicalgia: Secondary | ICD-10-CM | POA: Diagnosis not present

## 2015-01-07 DIAGNOSIS — M47812 Spondylosis without myelopathy or radiculopathy, cervical region: Secondary | ICD-10-CM | POA: Diagnosis not present

## 2015-01-07 DIAGNOSIS — M791 Myalgia: Secondary | ICD-10-CM | POA: Diagnosis not present

## 2015-01-07 DIAGNOSIS — M25519 Pain in unspecified shoulder: Secondary | ICD-10-CM | POA: Diagnosis not present

## 2015-01-08 DIAGNOSIS — M542 Cervicalgia: Secondary | ICD-10-CM | POA: Diagnosis not present

## 2015-01-15 DIAGNOSIS — M542 Cervicalgia: Secondary | ICD-10-CM | POA: Diagnosis not present

## 2015-01-17 DIAGNOSIS — M542 Cervicalgia: Secondary | ICD-10-CM | POA: Diagnosis not present

## 2015-01-28 LAB — HM MAMMOGRAPHY

## 2015-02-06 DIAGNOSIS — Z1389 Encounter for screening for other disorder: Secondary | ICD-10-CM | POA: Diagnosis not present

## 2015-02-06 DIAGNOSIS — Z01419 Encounter for gynecological examination (general) (routine) without abnormal findings: Secondary | ICD-10-CM | POA: Diagnosis not present

## 2015-02-06 DIAGNOSIS — Z1231 Encounter for screening mammogram for malignant neoplasm of breast: Secondary | ICD-10-CM | POA: Diagnosis not present

## 2015-02-06 DIAGNOSIS — Z124 Encounter for screening for malignant neoplasm of cervix: Secondary | ICD-10-CM | POA: Diagnosis not present

## 2015-02-06 LAB — HM MAMMOGRAPHY: HM Mammogram: NORMAL

## 2015-02-06 LAB — HM PAP SMEAR: HM Pap smear: NORMAL

## 2015-02-11 ENCOUNTER — Encounter: Payer: Self-pay | Admitting: Family Medicine

## 2015-04-17 ENCOUNTER — Telehealth: Payer: Self-pay | Admitting: *Deleted

## 2015-04-17 NOTE — Telephone Encounter (Signed)
Called pt to see if she had a mammogram this year and she stated she has.  She [facility] them [facility].  She does not remember the date but she will call back with that information

## 2015-04-18 ENCOUNTER — Telehealth: Payer: Self-pay | Admitting: Family Medicine

## 2015-04-18 NOTE — Telephone Encounter (Signed)
Pt returned Rhonda [name] call she had her Mammgram on 02/06/15 obgyn [facility]

## 2015-04-18 NOTE — Telephone Encounter (Signed)
Chart updated

## 2015-05-05 ENCOUNTER — Other Ambulatory Visit: Payer: Medicare PPO

## 2015-05-13 ENCOUNTER — Ambulatory Visit (INDEPENDENT_AMBULATORY_CARE_PROVIDER_SITE_OTHER): Payer: Medicare PPO | Admitting: Family Medicine

## 2015-05-13 ENCOUNTER — Encounter: Payer: Self-pay | Admitting: Family Medicine

## 2015-05-13 VITALS — BP 120/82 | HR 81 | Temp 97.8°F | Ht 62.0 in | Wt 130.0 lb

## 2015-05-13 DIAGNOSIS — M150 Primary generalized (osteo)arthritis: Secondary | ICD-10-CM

## 2015-05-13 DIAGNOSIS — M25511 Pain in right shoulder: Secondary | ICD-10-CM

## 2015-05-13 DIAGNOSIS — R319 Hematuria, unspecified: Secondary | ICD-10-CM

## 2015-05-13 DIAGNOSIS — M159 Polyosteoarthritis, unspecified: Secondary | ICD-10-CM

## 2015-05-13 DIAGNOSIS — Z0000 Encounter for general adult medical examination without abnormal findings: Secondary | ICD-10-CM | POA: Diagnosis not present

## 2015-05-13 DIAGNOSIS — M25512 Pain in left shoulder: Secondary | ICD-10-CM

## 2015-05-13 DIAGNOSIS — E785 Hyperlipidemia, unspecified: Secondary | ICD-10-CM

## 2015-05-13 DIAGNOSIS — R300 Dysuria: Secondary | ICD-10-CM

## 2015-05-13 LAB — COMPREHENSIVE METABOLIC PANEL
ALBUMIN: 4.6 g/dL (ref 3.5–5.2)
ALT: 23 U/L (ref 0–35)
AST: 21 U/L (ref 0–37)
Alkaline Phosphatase: 85 U/L (ref 39–117)
BUN: 20 mg/dL (ref 6–23)
CALCIUM: 9.9 mg/dL (ref 8.4–10.5)
CHLORIDE: 103 meq/L (ref 96–112)
CO2: 27 mEq/L (ref 19–32)
Creatinine, Ser: 0.8 mg/dL (ref 0.40–1.20)
GFR: 75.15 mL/min (ref >60.00)
GLUCOSE: 80 mg/dL (ref 70–99)
POTASSIUM: 4.1 meq/L (ref 3.5–5.1)
Sodium: 137 mEq/L (ref 135–145)
Total Bilirubin: 0.6 mg/dL (ref 0.2–1.2)
Total Protein: 7.4 g/dL (ref 6.0–8.3)

## 2015-05-13 LAB — CBC
HCT: 43 % (ref 36.0–46.0)
Hemoglobin: 14.4 g/dL (ref 12.0–15.0)
MCHC: 33.4 g/dL (ref 30.0–36.0)
MCV: 87.3 fl (ref 78.0–100.0)
Platelets: 272 10*3/uL (ref 150.0–400.0)
RBC: 4.92 Mil/uL (ref 3.87–5.11)
RDW: 14.9 % (ref 11.5–15.5)
WBC: 6.2 10*3/uL (ref 4.0–10.5)

## 2015-05-13 LAB — LIPID PANEL
CHOLESTEROL: 176 mg/dL (ref 0–200)
HDL: 55.1 mg/dL (ref >39.00)
LDL Cholesterol: 103 mg/dL — ABNORMAL HIGH (ref 0–99)
NonHDL: 120.9
Total CHOL/HDL Ratio: 3
Triglycerides: 90 mg/dL (ref 0.0–149.0)
VLDL: 18 mg/dL (ref 0.0–40.0)

## 2015-05-13 LAB — POCT URINALYSIS DIPSTICK
Bilirubin, UA: NEGATIVE
Glucose, UA: NEGATIVE
Ketones, UA: NEGATIVE
NITRITE UA: NEGATIVE
PROTEIN UA: NEGATIVE
Spec Grav, UA: 1.02
UROBILINOGEN UA: 0.2
pH, UA: 6

## 2015-05-13 LAB — TSH: TSH: 3.7 u[IU]/mL (ref 0.35–4.50)

## 2015-05-13 MED ORDER — TRIAMCINOLONE ACETONIDE 0.1 % EX CREA: 1.0000 "application " | TOPICAL_CREAM | Freq: Two times a day (BID) | CUTANEOUS | Status: DC

## 2015-05-13 NOTE — Progress Notes (Signed)
Rhonda Reddish, MD Phone: [phone]  Subjective:  Patient presents today for their annual physical. Chief complaint-noted.   Bilateral shoulder pain. [facility] Dr. Ernestina Patches. Cervical in origin. Dry needling through PT but much improved. Using water pillow and helping. Home exercise program.   No longer on regular ibuprofen  Rash on back of neck at least a month, scratches regularly  R inner foot  Spider veins- occasional aches  Regular exercise and reasonable diet  HLD- mild not on statin  Osteopenia- continued ca/vit D  Regular derm follow up  ROS- full  review of systems was completed and negative except for as noted above Pertinent negatives for rash: not ill appearing, no fever/chills. No new medications. Not immunocompromised. No mucus membrane involvement.   The following were reviewed and entered/updated in epic: Past Medical History  Diagnosis Date  . Rosacea     metrogel in past  . Allergy   . Hyperlipidemia   . Neck pain, chronic   . RBBB (right bundle branch block with left anterior fascicular block)    Patient Active Problem List   Diagnosis Date Noted  . CKD (chronic kidney disease), stage II 11/15/2014    Priority: Medium  . Osteoarthritis 11/18/2011    Priority: Medium  . Hyperlipidemia 09/05/2006    Priority: Medium  . Osteopenia 11/15/2014    Priority: Low  . Allergic rhinitis 11/15/2014    Priority: Low  . History of basal cell cancer 11/15/2014    Priority: Low  . Rosacea     Priority: Low  . Hematuria 09/25/2008    Priority: Low  . Bilateral shoulder pain 11/15/2014   Past Surgical History  Procedure Laterality Date  . Tubal ligation    . Tonsillectomy    . Bcc excision      x3  . Hysteroscopy    . Cholecystectomy      Family History  Problem Relation Age of Onset  . Cancer Father     unknown  . Alzheimer's disease Mother     died of bowel rupture    Medications- reviewed and updated Current Outpatient  Prescriptions  Medication Sig Dispense Refill  . calcium carbonate (OS-CAL) 600 MG TABS Take 600 mg by mouth 2 (two) times daily with a meal.      . cetirizine (ZYRTEC) 10 MG tablet Take 10 mg by mouth daily.      . Chlorpheniramine Maleate CR 12 MG CPCR Take 121 mg by mouth daily.      . Fish Oil-Cholecalciferol (FISH OIL + D3 PO) Take by mouth daily.    . fluticasone (FLONASE) 50 MCG/ACT nasal spray Place 2 sprays into both nostrils daily. 16 g 6  . Magnesium 250 MG TABS Take 1 tablet by mouth daily.       No current facility-administered medications for this visit.    Allergies-reviewed and updated No Known Allergies  History   Social History  . Marital Status: Married    Spouse Name: N/A  . Number of Children: N/A  . Years of Education: N/A   Social History Main Topics  . Smoking status: Former Smoker -- 0.75 packs/day for 10 years    Types: Cigarettes  . Smokeless tobacco: Not on file     Comment: quit at age 29  . Alcohol Use: 0.0 oz/week    0 Standard drinks or equivalent per week     Comment: once a month  . Drug Use: No  . Sexual Activity: Not on file  Other Topics Concern  . None   Social History Narrative   Married 1972. 2 children. No grandkids. 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     ROS--See HPI   Objective: BP 120/82 mmHg  Pulse 81  Temp(Src) 97.8 F (36.6 C)  Ht 5\' 2"  (1.575 m)  Wt 130 lb (58.968 kg)  BMI 23.77 kg/m2 Gen: NAD, resting comfortably HEENT: Mucous membranes are moist. Oropharynx normal. Good dentition.  Neck: no thyromegaly CV: RRR no murmurs rubs or gallops Lungs: CTAB no crackles, wheeze, rhonchi Abdomen: soft/nontender/nondistended/normal bowel sounds. No rebound or guarding.  Ext: no edema Skin: warm, dry, full skin exam declined to derm, just above hairline has 4 small excoriated papules, near right heal has  some dry, scaly skin with some 1-65mm macules erythematous at the base  Neuro: grossly normal, moves all extremities, PERRLA   Assessment/Plan:  69 y.o. female presenting for annual physical.  Health Maintenance counseling: 1. Anticipatory guidance: Patient counseled regarding regular dental exams, wearing seatbelts, wearing sunscreen, regular eye exam. Seeing dermatology at least yearly.  2. Risk factor reduction:  Advised patient of need for regular exercise (4 days a week currently- water walking tues/thurs and ellipitical other days) and diet rich and fruits and vegetables to reduce risk of heart attack and stroke.  3. Immunizations/screenings/ancillary studies- up to date 4. Ob/gyn doing breast and gyn exam  Rash on neck- in an itch scratch cycle and unable to tell underlying etiology. Treat with triamcinolone BID x 10 days and follow up derm if not improved  Rash on foot- appears to be tinea pedis. Advised OTC creams and follow up with derm if not imrpoved  Osteoarthritis Saw orthopedics. Improvement in cervical element with dry needling through PT. No longer on ibuprofen.   Hematuria We discussed repeating urology eval if progresses past trace and confirmed on micro. With leuk and blood- send for culture today  Hyperlipidemia 8.1% 10 year risk 2016, LDL only 103, HDL protective Will use threshold [facility] using at 12% for primary prevention   1 year CPE. 6 month f/u if desired or prn.   Orders Placed This Encounter  Procedures  . Urine culture  . CBC    [facility]  . Comprehensive metabolic panel    [facility]    Order Specific Question:  Has the patient fasted?    Answer:  No  . Lipid panel    [place]    Order Specific Question:  Has the patient fasted?    Answer:  No  . TSH    [place]  . POCT urinalysis dipstick    In house    Meds ordered this encounter  Medications  . triamcinolone cream (KENALOG) 0.1 %    Sig: Apply 1 application topically 2 (two) times daily. For 10  days to neck    Dispense:  30 g    Refill:  0

## 2015-05-13 NOTE — Patient Instructions (Addendum)
Update fasting labs today. Mychart message with results.   Otherwise I think you are doing great.   See me back in a year for a physical. Happy to check in with you in 6 months or as needed as well.   Use steroid for 10 days on your neck- follow up with dermatology if does not resolve  For spot at base of your foot, could be athlete's foot- would be consistent with tinactin or lotrimin cream OTC for 10 days to see if that will clear up  Glad the neck is better

## 2015-05-13 NOTE — Assessment & Plan Note (Signed)
Saw orthopedics. Improvement in cervical element with dry needling through PT. No longer on ibuprofen.

## 2015-05-13 NOTE — Assessment & Plan Note (Signed)
We discussed repeating urology eval if progresses past trace and confirmed on micro. With leuk and blood- send for culture today

## 2015-05-13 NOTE — Assessment & Plan Note (Signed)
8.1% 10 year risk 2016, LDL only 103, HDL protective Will use threshold VA using at 12% for primary prevention

## 2015-05-15 LAB — URINE CULTURE: Colony Count: 9000

## 2015-05-20 ENCOUNTER — Encounter: Payer: Self-pay | Admitting: *Deleted

## 2015-07-16 ENCOUNTER — Ambulatory Visit (INDEPENDENT_AMBULATORY_CARE_PROVIDER_SITE_OTHER): Payer: Medicare PPO | Admitting: Adult Health

## 2015-07-16 ENCOUNTER — Encounter: Payer: Self-pay | Admitting: Adult Health

## 2015-07-16 ENCOUNTER — Telehealth: Payer: Self-pay | Admitting: Family Medicine

## 2015-07-16 VITALS — BP 104/70 | Temp 97.7°F | Ht 62.0 in | Wt 131.4 lb

## 2015-07-16 DIAGNOSIS — H01003 Unspecified blepharitis right eye, unspecified eyelid: Secondary | ICD-10-CM

## 2015-07-16 MED ORDER — AZITHROMYCIN 1 % OP SOLN: OPHTHALMIC | Status: DC

## 2015-07-16 NOTE — Telephone Encounter (Signed)
Pls advise.  

## 2015-07-16 NOTE — Progress Notes (Signed)
Pre visit review using our clinic review tool, if applicable. No additional management support is needed unless otherwise documented below in the visit note. 

## 2015-07-16 NOTE — Telephone Encounter (Signed)
Pt unsure about how to use the following med azithromycin (AZASITE) 1 % ophthalmic solution  She said Tommi Rumps told her to use the med on her eyelid but the directions told her to put into her eye. Pt confused would like a call back

## 2015-07-16 NOTE — Telephone Encounter (Signed)
Spoke to patient and advised to apply the medication topically.

## 2015-07-16 NOTE — Progress Notes (Signed)
Subjective:    Patient ID: Rhonda [name], female    DOB: Jun 03, 1946, 69 y.o.   MRN: [id number]  HPI  69 year old female who presents to the office for 24 hours of right eye lid pain, redness, and swelling. She denies any drainage from eyeor visual disturbance.  no sick contacts. She has been using a warm compress with slight relief.     Review of Systems  Constitutional: Negative.   HENT: Negative.   Eyes: Positive for pain. Negative for photophobia, discharge, redness, itching and visual disturbance.  Skin: Positive for color change.  Neurological: Negative for headaches.   Past Medical History  Diagnosis Date  . Rosacea     metrogel in past  . Allergy   . Hyperlipidemia   . Neck pain, chronic   . RBBB (right bundle branch block with left anterior fascicular block)     Social History   Social History  . Marital Status: Married    Spouse Name: N/A  . Number of Children: N/A  . Years of Education: N/A   Occupational History  . Not on file.   Social History Main Topics  . Smoking status: Former Smoker -- 0.75 packs/day for 10 years    Types: Cigarettes  . Smokeless tobacco: Not on file     Comment: quit at age 88  . Alcohol Use: 0.0 oz/week    0 Standard drinks or equivalent per week     Comment: once a month  . Drug Use: No  . Sexual Activity: Not on file   Other Topics Concern  . Not on file   Social History Narrative   Married 1972. 2 children. No grandkids. 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     Past Surgical History  Procedure Laterality Date  . Tubal ligation    . Tonsillectomy    . Bcc excision      x3  . Hysteroscopy    . Cholecystectomy      Family History  Problem Relation Age of Onset  . Cancer Father     unknown  . Alzheimer's disease Mother     died of bowel rupture    No Known Allergies  Current Outpatient  Prescriptions on File Prior to Visit  Medication Sig Dispense Refill  . calcium carbonate (OS-CAL) 600 MG TABS Take 600 mg by mouth 2 (two) times daily with a meal.      . cetirizine (ZYRTEC) 10 MG tablet Take 10 mg by mouth daily.      . Chlorpheniramine Maleate CR 12 MG CPCR Take 121 mg by mouth daily.      . Fish Oil-Cholecalciferol (FISH OIL + D3 PO) Take by mouth daily.    . fluticasone (FLONASE) 50 MCG/ACT nasal spray Place 2 sprays into both nostrils daily. 16 g 6  . Magnesium 250 MG TABS Take 1 tablet by mouth daily.       No current facility-administered medications on file prior to visit.    BP 104/70 mmHg  Temp(Src) 97.7 F (36.5 C) (Oral)  Ht 5\' 2"  (1.575 m)  Wt 131 lb 6.4 oz (59.603 kg)  BMI 24.03 kg/m2       Objective:   Physical Exam  Constitutional: She is oriented to person, place, and time. She appears well-developed and well-nourished. No distress.  Eyes: Conjunctivae and EOM are normal.  Pupils are equal, round, and reactive to light. Right eye exhibits no discharge. Left eye exhibits no discharge. No scleral icterus.  Redness and swelling to right upper eye lid. No stye apparent. No drainage.  Neurological: She is alert and oriented to person, place, and time.  Skin: Skin is warm and dry. No rash noted. She is not diaphoretic. No erythema. No pallor.  Psychiatric: She has a normal mood and affect. Her behavior is normal. Thought content normal.  Vitals reviewed.      Assessment & Plan:  1. Blepharitis of right eyelid - azithromycin (AZASITE) 1 % ophthalmic solution; Apply 1 drop to the affected eye(s) twice daily for 10 days.  Dispense: 2.5 mL; Refill: 1.  - Advised to apply medication directly to eye lid - Continue with warm compress - Follow up if no improvement or if symptoms worsen.

## 2015-07-16 NOTE — Patient Instructions (Addendum)
It was great meeting you today!   I have sent in a prescription to the pharmacy for azithromycin eye drops. Apply a thin layer to the eye lid twice a day for the next 10 days. Continue to use a warm compress.    Follow up if no improvement .   Blepharitis Blepharitis is redness, soreness, and swelling (inflammation) of one or both eyelids. It may be caused by an allergic reaction or a bacterial infection. Blepharitis may also be associated with reddened, scaly skin (seborrhea) of the scalp and eyebrows. While you sleep, eye discharge may cause your eyelashes to stick together. Your eyelids may itch, burn, swell, and may lose their lashes. These will grow back. Your eyes may become sensitive. Blepharitis may recur and need repeated treatment. If this is the case, you may require further evaluation by an eye specialist (ophthalmologist). HOME CARE INSTRUCTIONS   Keep your hands clean.  Use a clean towel each time you dry your eyelids. Do not use this towel to clean other areas. Do not share a towel or makeup with anyone.  Wash your eyelids with warm water or warm water mixed with a small amount of baby shampoo. Do this twice a day or as often as needed.  Wash your face and eyebrows at least once a day.  Use warm compresses 2 times a day for 10 minutes at a time, or as directed by your caregiver.  Apply antibiotic ointment as directed by your caregiver.  Avoid rubbing your eyes.  Avoid wearing makeup until you get better.  Follow up with your caregiver as directed. SEEK IMMEDIATE MEDICAL CARE IF:   You have pain, redness, or swelling that gets worse or spreads to other parts of your face.  Your vision changes, or you have pain when looking at lights or moving objects.  You have a fever.  Your symptoms continue for longer than 2 to 4 days or become worse. MAKE SURE YOU:   Understand these instructions.  Will watch your condition.  Will get help right away if you are not doing  well or get worse. Document Released: 11/12/2000 Document Revised: 02/07/2012 Document Reviewed: 12/23/2010 [facility] Patient Information 2015 [place], [place]. This information is not intended to replace advice given to you by your health care provider. Make sure you discuss any questions you have with your health care provider.

## 2015-07-17 DIAGNOSIS — Z961 Presence of intraocular lens: Secondary | ICD-10-CM | POA: Diagnosis not present

## 2015-07-17 DIAGNOSIS — H00021 Hordeolum internum right upper eyelid: Secondary | ICD-10-CM | POA: Diagnosis not present

## 2015-07-31 DIAGNOSIS — H0015 Chalazion left lower eyelid: Secondary | ICD-10-CM | POA: Diagnosis not present

## 2015-07-31 DIAGNOSIS — H00021 Hordeolum internum right upper eyelid: Secondary | ICD-10-CM | POA: Diagnosis not present

## 2015-09-03 DIAGNOSIS — H02831 Dermatochalasis of right upper eyelid: Secondary | ICD-10-CM | POA: Diagnosis not present

## 2015-09-03 DIAGNOSIS — H10413 Chronic giant papillary conjunctivitis, bilateral: Secondary | ICD-10-CM | POA: Diagnosis not present

## 2015-09-03 DIAGNOSIS — H02834 Dermatochalasis of left upper eyelid: Secondary | ICD-10-CM | POA: Diagnosis not present

## 2015-09-03 DIAGNOSIS — L720 Epidermal cyst: Secondary | ICD-10-CM | POA: Diagnosis not present

## 2015-09-03 DIAGNOSIS — H00021 Hordeolum internum right upper eyelid: Secondary | ICD-10-CM | POA: Diagnosis not present

## 2015-09-03 DIAGNOSIS — Z961 Presence of intraocular lens: Secondary | ICD-10-CM | POA: Diagnosis not present

## 2015-09-04 ENCOUNTER — Ambulatory Visit: Payer: Medicare PPO | Admitting: *Deleted

## 2015-09-12 ENCOUNTER — Ambulatory Visit (INDEPENDENT_AMBULATORY_CARE_PROVIDER_SITE_OTHER): Payer: Medicare PPO

## 2015-09-12 DIAGNOSIS — Z23 Encounter for immunization: Secondary | ICD-10-CM

## 2015-09-29 DIAGNOSIS — L738 Other specified follicular disorders: Secondary | ICD-10-CM | POA: Diagnosis not present

## 2015-09-29 DIAGNOSIS — L570 Actinic keratosis: Secondary | ICD-10-CM | POA: Diagnosis not present

## 2015-09-29 DIAGNOSIS — L821 Other seborrheic keratosis: Secondary | ICD-10-CM | POA: Diagnosis not present

## 2015-09-29 DIAGNOSIS — C44311 Basal cell carcinoma of skin of nose: Secondary | ICD-10-CM | POA: Diagnosis not present

## 2015-09-29 DIAGNOSIS — D485 Neoplasm of uncertain behavior of skin: Secondary | ICD-10-CM | POA: Diagnosis not present

## 2015-09-29 DIAGNOSIS — L720 Epidermal cyst: Secondary | ICD-10-CM | POA: Diagnosis not present

## 2015-10-15 DIAGNOSIS — D3131 Benign neoplasm of right choroid: Secondary | ICD-10-CM | POA: Diagnosis not present

## 2015-10-15 DIAGNOSIS — Z961 Presence of intraocular lens: Secondary | ICD-10-CM | POA: Diagnosis not present

## 2015-10-15 DIAGNOSIS — H26492 Other secondary cataract, left eye: Secondary | ICD-10-CM | POA: Diagnosis not present

## 2015-10-15 DIAGNOSIS — H47322 Drusen of optic disc, left eye: Secondary | ICD-10-CM | POA: Diagnosis not present

## 2015-10-27 ENCOUNTER — Telehealth: Payer: Self-pay | Admitting: Family Medicine

## 2015-10-27 MED ORDER — LORAZEPAM 1 MG PO TABS: 1.0000 mg | ORAL_TABLET | Freq: Two times a day (BID) | ORAL | Status: DC | PRN

## 2015-10-27 NOTE — Telephone Encounter (Signed)
Called and spoke with pt and relayed below information. Ativan called in to [facility] pharmacy.

## 2015-10-27 NOTE — Telephone Encounter (Signed)
Please tell patient we are sorry for her loss. May want to consider grief counseling at some point.   May provide ativan 1mg  PO BID PRN #10. Please let patient know she is at risk for falls on medicine and she cannot drive for 8 hours after taking this. Use very very sparingly and if she needs refill would advise appointment so we can discuss future steps.

## 2015-10-27 NOTE — Telephone Encounter (Signed)
Ms. Diemert called saying her son has passed away and she's wondering if a Rx can be sent to her [facility] pharmacy on [address] to help deal with the stress. I offered her an appt to come in today but she's going out of town to make arrangements. Please call the pt if you have questions or concerns.  Pt ph# [phone] (cell) or [phone] (other)  Thank you.

## 2015-10-27 NOTE — Telephone Encounter (Signed)
See below

## 2015-11-18 DIAGNOSIS — Z85828 Personal history of other malignant neoplasm of skin: Secondary | ICD-10-CM | POA: Diagnosis not present

## 2015-11-18 DIAGNOSIS — C44311 Basal cell carcinoma of skin of nose: Secondary | ICD-10-CM | POA: Diagnosis not present

## 2016-04-08 ENCOUNTER — Other Ambulatory Visit: Payer: Self-pay | Admitting: Family Medicine

## 2016-05-19 ENCOUNTER — Telehealth: Payer: Self-pay | Admitting: Family Medicine

## 2016-05-19 ENCOUNTER — Encounter: Payer: Self-pay | Admitting: Family Medicine

## 2016-05-19 ENCOUNTER — Ambulatory Visit (INDEPENDENT_AMBULATORY_CARE_PROVIDER_SITE_OTHER): Payer: Medicare Other | Admitting: Family Medicine

## 2016-05-19 VITALS — BP 104/78 | HR 79 | Temp 97.5°F | Ht 62.25 in | Wt 130.0 lb

## 2016-05-19 DIAGNOSIS — M85851 Other specified disorders of bone density and structure, right thigh: Secondary | ICD-10-CM | POA: Diagnosis not present

## 2016-05-19 DIAGNOSIS — M8580 Other specified disorders of bone density and structure, unspecified site: Secondary | ICD-10-CM | POA: Diagnosis not present

## 2016-05-19 DIAGNOSIS — Z0001 Encounter for general adult medical examination with abnormal findings: Secondary | ICD-10-CM | POA: Diagnosis not present

## 2016-05-19 DIAGNOSIS — E785 Hyperlipidemia, unspecified: Secondary | ICD-10-CM | POA: Diagnosis not present

## 2016-05-19 DIAGNOSIS — R319 Hematuria, unspecified: Secondary | ICD-10-CM

## 2016-05-19 DIAGNOSIS — R6889 Other general symptoms and signs: Secondary | ICD-10-CM

## 2016-05-19 LAB — COMPREHENSIVE METABOLIC PANEL
ALBUMIN: 4.4 g/dL (ref 3.5–5.2)
ALT: 21 U/L (ref 0–35)
AST: 22 U/L (ref 0–37)
Alkaline Phosphatase: 89 U/L (ref 39–117)
BUN: 22 mg/dL (ref 6–23)
CALCIUM: 9.6 mg/dL (ref 8.4–10.5)
CHLORIDE: 104 meq/L (ref 96–112)
CO2: 29 mEq/L (ref 19–32)
CREATININE: 0.73 mg/dL (ref 0.40–1.20)
GFR: 83.29 mL/min (ref >60.00)
Glucose, Bld: 83 mg/dL (ref 70–99)
POTASSIUM: 4.4 meq/L (ref 3.5–5.1)
Sodium: 141 mEq/L (ref 135–145)
Total Bilirubin: 0.6 mg/dL (ref 0.2–1.2)
Total Protein: 7.2 g/dL (ref 6.0–8.3)

## 2016-05-19 LAB — LIPID PANEL
CHOLESTEROL: 202 mg/dL — AB (ref 0–200)
HDL: 64 mg/dL (ref >39.00)
LDL CALC: 115 mg/dL — AB (ref 0–99)
NonHDL: 137.82
TRIGLYCERIDES: 116 mg/dL (ref 0.0–149.0)
Total CHOL/HDL Ratio: 3
VLDL: 23.2 mg/dL (ref 0.0–40.0)

## 2016-05-19 LAB — CBC
HEMATOCRIT: 42.4 % (ref 36.0–46.0)
Hemoglobin: 14.2 g/dL (ref 12.0–15.0)
MCHC: 33.5 g/dL (ref 30.0–36.0)
MCV: 88.4 fl (ref 78.0–100.0)
PLATELETS: 275 10*3/uL (ref 150.0–400.0)
RBC: 4.79 Mil/uL (ref 3.87–5.11)
RDW: 14.6 % (ref 11.5–15.5)
WBC: 6.8 10*3/uL (ref 4.0–10.5)

## 2016-05-19 LAB — URINALYSIS, MICROSCOPIC ONLY: RBC / HPF: NONE SEEN (ref 0–?)

## 2016-05-19 LAB — POC URINALSYSI DIPSTICK (AUTOMATED)
BILIRUBIN UA: NEGATIVE
Glucose, UA: NEGATIVE
KETONES UA: NEGATIVE
NITRITE UA: NEGATIVE
PH UA: 7
PROTEIN UA: NEGATIVE
Spec Grav, UA: 1.02
Urobilinogen, UA: 0.2

## 2016-05-19 NOTE — Addendum Note (Signed)
Addended by: Gari Crown D on: 05/19/2016 11:38 AM   Modules accepted: Orders

## 2016-05-19 NOTE — Patient Instructions (Addendum)
Schedule your bone density test at check out desk  Labs before you go  No changes to medications

## 2016-05-19 NOTE — Progress Notes (Signed)
Phone: [phone]  Subjective:  Patient presents today for their annual physical. Chief complaint-noted.   See problem oriented charting- ROS- full  review of systems was completed and negative including No chest pain or shortness of breath. No headache or blurry vision.   The following were reviewed and entered/updated in epic: Past Medical History  Diagnosis Date  . Rosacea     metrogel in past  . Allergy   . Hyperlipidemia   . Neck pain, chronic   . RBBB (right bundle branch block with left anterior fascicular block)    Patient Active Problem List   Diagnosis Date Noted  . Osteoarthritis 11/18/2011    Priority: Medium  . Hyperlipidemia 09/05/2006    Priority: Medium  . Osteopenia 11/15/2014    Priority: Low  . Allergic rhinitis 11/15/2014    Priority: Low  . History of basal cell cancer 11/15/2014    Priority: Low  . Rosacea     Priority: Low  . Hematuria 09/25/2008    Priority: Low  . Bilateral shoulder pain 11/15/2014   Past Surgical History  Procedure Laterality Date  . Tubal ligation    . Tonsillectomy    . Bcc excision      x3  . Hysteroscopy    . Cholecystectomy      Family History  Problem Relation Age of Onset  . Cancer Father     unknown  . Alzheimer's disease Mother     died of bowel rupture  . CAD Son     23- died of MI    Medications- reviewed and updated Current Outpatient Prescriptions  Medication Sig Dispense Refill  . calcium carbonate (OS-CAL) 600 MG TABS Take 600 mg by mouth 2 (two) times daily with a meal.      . cetirizine (ZYRTEC) 10 MG tablet Take 10 mg by mouth daily.      . Chlorpheniramine Maleate CR 12 MG CPCR Take 121 mg by mouth daily.      . Fish Oil-Cholecalciferol (FISH OIL + D3 PO) Take by mouth daily.    . fluticasone (FLONASE) 50 MCG/ACT nasal spray Place 2 sprays into both nostrils daily. 16 g 6  . Magnesium 250 MG TABS Take 1 tablet by mouth daily.      Marland Kitchen triamcinolone cream (KENALOG) 0.1 % APPLY 1 APPLICATION  TOPICALLY 2 (TWO) TIMES DAILY. FOR 10 DAYS TO NECK 30 g 3   Allergies-reviewed and updated No Known Allergies  Social History   Social History  . Marital Status: Married    Spouse Name: N/A  . Number of Children: N/A  . Years of Education: N/A   Social History Main Topics  . Smoking status: Former Smoker -- 0.75 packs/day for 10 years    Types: Cigarettes  . Smokeless tobacco: None     Comment: quit at age 37  . Alcohol Use: 0.0 oz/week    0 Standard drinks or equivalent per week     Comment: once a month  . Drug Use: No  . Sexual Activity: Not Asked   Other Topics Concern  . None   Social History Narrative   Married 1972. 1 living child daughter [name]. lost son 27 years old to [place] in 2017. No grandkids. 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     Objective: BP 104/78 mmHg  Pulse 79  Temp(Src) 97.5 F (36.4  C) (Oral)  Ht 5' 2.25" (1.581 m)  Wt 130 lb (58.968 kg)  BMI 23.59 kg/m2  SpO2 99% Gen: NAD, resting comfortably HEENT: Mucous membranes are moist. Oropharynx normal Neck: no thyromegaly CV: RRR no murmurs rubs or gallops Lungs: CTAB no crackles, wheeze, rhonchi Abdomen: soft/nontender/nondistended/normal bowel sounds. No rebound or guarding.  Ext: no edema Skin: warm, dry Neuro: grossly normal, moves all extremities, PERRLA  Assessment/Plan:  70 y.o. female presenting for annual physical.  Health Maintenance counseling: 1. Anticipatory guidance: Patient counseled regarding regular dental exams, eye exams, wearing seatbelts.  2. Risk factor reduction:  Advised patient of need for regular exercise and diet rich and fruits and vegetables to reduce risk of heart attack and stroke. Maintaining weight- walking regularly or in water or water aerobics 3. Immunizations/screenings/ancillary studies Immunization History  Administered Date(s) Administered    . Influenza Split 10/04/2011, 09/12/2012  . Influenza Whole 10/22/2009, 09/03/2010  . Influenza, High Dose Seasonal PF 09/05/2013, 09/12/2015  . Influenza,inj,Quad PF,36+ Mos 09/06/2014  . Pneumococcal Conjugate-13 09/06/2014  . Pneumococcal Polysaccharide-23 10/22/2009  . Td 09/25/2008  Shingles shot at age 31 reported 10. Cervical cancer screening- passed age based screening, still sees gyn 5. Breast cancer screening-  jmammogram 03/04/16- getting records 6. Colon cancer screening - had one in 2012, I do not have records- needs repeat this year. Asked patient to give GI physician my card and have records sent to [place] 7. Skin cancer screening- history of basal cell- sees dermatology regularly  Status of chronic or acute concerns  OA followed with Dr. Shellia Carwin ortho in past- not recently. Still dealing with issues low back, neck, knees- but overall tolearble  HLD- risk under 10% in 2016, update today. Despite son's MI- does not want to be on statin unless risk over 10%  Osteopenia- diagnosed 2014 on calcium and vitamin D, -2.1 right neck  Hematuria- refer back to urology if progresses past trace  Return in about 1 year (around 05/19/2017).  Orders Placed This Encounter  Procedures  . DG Bone Density    Standing Status: Future     Number of Occurrences:      Standing Expiration Date: 07/19/2017    Order Specific Question:  Reason for Exam (SYMPTOM  OR DIAGNOSIS REQUIRED)    Answer:  osteopenia right femoral neck- follow up from 2014    Order Specific Question:  Preferred imaging location?    Answer:  [facility]  . CBC    [facility]  . Comprehensive metabolic panel    [place]    Order Specific Question:  Has the patient fasted?    Answer:  No  . Lipid panel    [place]    Order Specific Question:  Has the patient fasted?    Answer:  No  . POCT Urinalysis Dipstick (Automated)   Return precautions advised.   Garret Reddish, MD

## 2016-05-19 NOTE — Telephone Encounter (Signed)
Pt called to let you know the Gastroenterologists name Dr. Earle Gell  [phone]

## 2016-05-19 NOTE — Progress Notes (Signed)
Pre visit review using our clinic review tool, if applicable. No additional management support is needed unless otherwise documented below in the visit note. 

## 2016-05-19 NOTE — Telephone Encounter (Signed)
Noted, had asked her to have him send [place] next colonoscopy- plan is late 2017

## 2016-06-02 ENCOUNTER — Other Ambulatory Visit: Payer: Self-pay | Admitting: Gastroenterology

## 2016-06-07 ENCOUNTER — Ambulatory Visit (INDEPENDENT_AMBULATORY_CARE_PROVIDER_SITE_OTHER)
Admission: RE | Admit: 2016-06-07 | Discharge: 2016-06-07 | Disposition: A | Discharge status: Home / Self Care | Payer: Medicare Other | Source: Ambulatory Visit | Attending: Family Medicine | Admitting: Family Medicine

## 2016-06-07 DIAGNOSIS — M85851 Other specified disorders of bone density and structure, right thigh: Secondary | ICD-10-CM | POA: Diagnosis not present

## 2016-06-07 DIAGNOSIS — M8580 Other specified disorders of bone density and structure, unspecified site: Secondary | ICD-10-CM

## 2016-06-18 ENCOUNTER — Other Ambulatory Visit: Payer: Self-pay

## 2016-06-18 DIAGNOSIS — M8580 Other specified disorders of bone density and structure, unspecified site: Secondary | ICD-10-CM

## 2016-06-18 MED ORDER — ALENDRONATE SODIUM 70 MG PO TABS: 70.0000 mg | ORAL_TABLET | ORAL | Status: DC

## 2016-07-13 ENCOUNTER — Encounter (HOSPITAL_COMMUNITY): Payer: Self-pay | Admitting: *Deleted

## 2016-07-20 ENCOUNTER — Ambulatory Visit (HOSPITAL_COMMUNITY): Payer: Medicare Other | Admitting: Certified Registered Nurse Anesthetist

## 2016-07-20 ENCOUNTER — Encounter (HOSPITAL_COMMUNITY)
Admission: RE | Disposition: A | Discharge status: Home / Self Care | Payer: Self-pay | Source: Ambulatory Visit | Attending: Gastroenterology

## 2016-07-20 ENCOUNTER — Encounter (HOSPITAL_COMMUNITY): Payer: Self-pay

## 2016-07-20 ENCOUNTER — Ambulatory Visit (HOSPITAL_COMMUNITY)
Admission: RE | Admit: 2016-07-20 | Discharge: 2016-07-20 | Disposition: A | Discharge status: Home / Self Care | Payer: Medicare Other | Source: Ambulatory Visit | Attending: Gastroenterology | Admitting: Gastroenterology

## 2016-07-20 DIAGNOSIS — Z800 Family history of malignant neoplasm of digestive organs: Secondary | ICD-10-CM | POA: Diagnosis not present

## 2016-07-20 DIAGNOSIS — Z87891 Personal history of nicotine dependence: Secondary | ICD-10-CM | POA: Diagnosis not present

## 2016-07-20 DIAGNOSIS — Z1211 Encounter for screening for malignant neoplasm of colon: Secondary | ICD-10-CM | POA: Diagnosis present

## 2016-07-20 DIAGNOSIS — K635 Polyp of colon: Secondary | ICD-10-CM | POA: Diagnosis not present

## 2016-07-20 DIAGNOSIS — K5730 Diverticulosis of large intestine without perforation or abscess without bleeding: Secondary | ICD-10-CM | POA: Insufficient documentation

## 2016-07-20 DIAGNOSIS — Z86010 Personal history of colonic polyps: Secondary | ICD-10-CM | POA: Insufficient documentation

## 2016-07-20 DIAGNOSIS — D120 Benign neoplasm of cecum: Secondary | ICD-10-CM | POA: Insufficient documentation

## 2016-07-20 HISTORY — DX: Unspecified osteoarthritis, unspecified site: M19.90

## 2016-07-20 HISTORY — PX: COLONOSCOPY WITH PROPOFOL: SHX5780

## 2016-07-20 SURGERY — COLONOSCOPY WITH PROPOFOL
Anesthesia: Monitor Anesthesia Care

## 2016-07-20 MED ORDER — PROPOFOL 500 MG/50ML IV EMUL
INTRAVENOUS | Status: DC | PRN
  Administered 2016-07-20: 75 ug/kg/min via INTRAVENOUS

## 2016-07-20 MED ORDER — ONDANSETRON HCL 4 MG/2ML IJ SOLN
INTRAMUSCULAR | Status: AC
  Filled 2016-07-20: qty 2

## 2016-07-20 MED ORDER — FENTANYL CITRATE (PF) 100 MCG/2ML IJ SOLN: 25.0000 ug | INTRAMUSCULAR | Status: DC | PRN

## 2016-07-20 MED ORDER — PROPOFOL 10 MG/ML IV BOLUS
INTRAVENOUS | Status: AC
  Filled 2016-07-20: qty 60

## 2016-07-20 MED ORDER — PROPOFOL 10 MG/ML IV BOLUS
INTRAVENOUS | Status: DC | PRN
  Administered 2016-07-20 (×3): 20 mg via INTRAVENOUS

## 2016-07-20 MED ORDER — ONDANSETRON HCL 4 MG/2ML IJ SOLN: 4.0000 mg | Freq: Once | INTRAMUSCULAR | Status: DC | PRN

## 2016-07-20 MED ORDER — LACTATED RINGERS IV SOLN
INTRAVENOUS | Status: DC
  Administered 2016-07-20: 07:00:00 via INTRAVENOUS

## 2016-07-20 MED ORDER — LIDOCAINE HCL (CARDIAC) 20 MG/ML IV SOLN
INTRAVENOUS | Status: DC | PRN
  Administered 2016-07-20: 100 mg via INTRAVENOUS

## 2016-07-20 MED ORDER — SODIUM CHLORIDE 0.9 % IV SOLN: INTRAVENOUS | Status: DC

## 2016-07-20 MED ORDER — ONDANSETRON HCL 4 MG/2ML IJ SOLN
INTRAMUSCULAR | Status: DC | PRN
  Administered 2016-07-20: 4 mg via INTRAVENOUS

## 2016-07-20 MED ORDER — LIDOCAINE HCL (CARDIAC) 20 MG/ML IV SOLN
INTRAVENOUS | Status: AC
  Filled 2016-07-20: qty 5

## 2016-07-20 SURGICAL SUPPLY — 22 items

## 2016-07-20 NOTE — H&P (Signed)
  Procedure: Surveillance colonoscopy. 05/10/2011 screening colonoscopy was performed with removal of a diminutive cecal tubular adenomatous polyp.  History: The patient is a 70 year old female born 08-Jan-1946. She is scheduled to undergo a surveillance colonoscopy today.  Medication allergies: None  Family history: Grandfather diagnosed with colon cancer  Past medical history: No chronic medical problems  Exam: The patient is alert and lying comfortably on the endoscopy stretcher. Abdomen is soft and nontender to palpation. Lungs are clear to auscultation. Cardiac exam reveals a regular rhythm.  Plan: Proceed with surveillance colonoscopy

## 2016-07-20 NOTE — Transfer of Care (Signed)
Immediate Anesthesia Transfer of Care Note  Patient: Rhonda [name]  Procedure(s) Performed: Procedure(s): COLONOSCOPY WITH PROPOFOL (N/A)  Patient Location: ENDO  Anesthesia Type:MAC  Level of Consciousness:  sedated, patient cooperative and responds to stimulation  Airway & Oxygen Therapy:Patient Spontanous Breathing and Patient connected to face mask oxgen  Post-op Assessment:  Report given to ENDO RN and Post -op Vital signs reviewed and stable  Post vital signs:  Reviewed and stable  Last Vitals:  Vitals:   07/20/16 0648  BP: (!) 117/55  Pulse: 70  Resp: 17  Temp: [id number] C    Complications: No apparent anesthesia complications

## 2016-07-20 NOTE — Anesthesia Postprocedure Evaluation (Signed)
Anesthesia Post Note  Patient: Rhonda [name]  Procedure(s) Performed: Procedure(s) (LRB): COLONOSCOPY WITH PROPOFOL (N/A)  Patient location during evaluation: PACU Anesthesia Type: MAC Level of consciousness: awake and alert Pain management: pain level controlled Vital Signs Assessment: post-procedure vital signs reviewed and stable Respiratory status: spontaneous breathing, nonlabored ventilation, respiratory function stable and patient connected to nasal cannula oxygen Cardiovascular status: stable and blood pressure returned to baseline Anesthetic complications: no    Last Vitals:  Vitals:   07/20/16 0648 07/20/16 0815  BP: (!) 117/55 122/63  Pulse: 70 66  Resp: 17 (!) 28  Temp: 36.4 C 36.6 C    Last Pain:  Vitals:   07/20/16 0648  TempSrc: Oral                 Zenaida Deed

## 2016-07-20 NOTE — Op Note (Signed)
[facility] Patient Name: Rhonda [name] Procedure Date: 07/20/2016 MRN: [id number] Attending MD: Garlan Fair , MD Date of Birth: 02/09/1946 CSN: [id number] Age: 70 Admit Type: Outpatient Procedure:                Colonoscopy Indications:              High risk colon cancer surveillance: Personal                            history of adenoma less than 10 mm in size Providers:                Garlan Fair, MD, Laverta Baltimore RN, RN,                            Cherylynn Ridges, Technician, Adair Laundry, CRNA Referring MD:              Medicines:                Propofol per Anesthesia Complications:            No immediate complications. Estimated Blood Loss:     Estimated blood loss: none. Procedure:                Pre-Anesthesia Assessment:                           - Prior to the procedure, a History and Physical                            was performed, and patient medications and                            allergies were reviewed. The patient's tolerance of                            previous anesthesia was also reviewed. The risks                            and benefits of the procedure and the sedation                            options and risks were discussed with the patient.                            All questions were answered, and informed consent                            was obtained. Prior Anticoagulants: The patient has                            taken no previous anticoagulant or antiplatelet                            agents. ASA Grade Assessment: II - A patient with  mild systemic disease. After reviewing the risks                            and benefits, the patient was deemed in                            satisfactory condition to undergo the procedure.                           After obtaining informed consent, the colonoscope                            was passed under direct vision. Throughout the               procedure, the patient's blood pressure, pulse, and                            oxygen saturations were monitored continuously. The                            [id number] scope was introduced through                            the anus and advanced to the the cecum, identified                            by appendiceal orifice and ileocecal valve. The                            colonoscopy was technically difficult and complex                            due to significant looping. The patient tolerated                            the procedure well. The quality of the bowel                            preparation was good. The appendiceal orifice and                            the rectum were photographed. Scope In: 7:37:04 AM Scope Out: 8:03:59 AM Scope Withdrawal Time: 0 hours 13 minutes 31 seconds  Total Procedure Duration: 0 hours 26 minutes 55 seconds  Findings:      The perianal and digital rectal examinations were normal.      A 3 mm polyp was found in the cecum. The polyp was sessile. The polyp       was removed with a cold biopsy forceps. Resection and retrieval were       complete.      A 5 mm polyp was found in the cecum. The polyp was sessile. The polyp       was removed with a cold snare. Resection and retrieval were complete.      A 3 mm polyp was found in the  mid ascending colon. The polyp was       sessile. The polyp was removed with a cold biopsy forceps. Resection and       retrieval were complete.      Multiple small and large-mouthed diverticula were found in the sigmoid       colon.      The exam was otherwise without abnormality. Impression:               - One 3 mm polyp in the cecum, removed with a cold                            biopsy forceps. Resected and retrieved.                           - One 5 mm polyp in the cecum, removed with a cold                            snare. Resected and retrieved.                           - One 3 mm polyp in  the mid ascending colon,                            removed with a cold biopsy forceps. Resected and                            retrieved.                           - Diverticulosis in the sigmoid colon.                           - The examination was otherwise normal. Moderate Sedation:      N/A- Per Anesthesia Care Recommendation:           - Patient has a contact number available for                            emergencies. The signs and symptoms of potential                            delayed complications were discussed with the                            patient. Return to normal activities tomorrow.                            Written discharge instructions were provided to the                            patient.                           - Repeat colonoscopy date to be determined after  pending pathology results are reviewed for                            surveillance.                           - Resume previous diet.                           - Continue present medications. Procedure Code(s):        --- Professional ---                           [phone], Colonoscopy, flexible; with removal of                            tumor(s), polyp(s), or other lesion(s) by snare                            technique                           45380, 63, Colonoscopy, flexible; with biopsy,                            single or multiple Diagnosis Code(s):        --- Professional ---                           Z86.010, Personal history of colonic polyps                           D12.0, Benign neoplasm of cecum                           D12.2, Benign neoplasm of ascending colon                           K57.30, Diverticulosis of large intestine without                            perforation or abscess without bleeding CPT copyright 2016 American Medical Association. All rights reserved. The codes documented in this report are preliminary and upon coder review may  be revised  to meet current compliance requirements. Earle Gell, MD Garlan Fair, MD 07/20/2016 8:11:15 AM This report has been signed electronically. Number of Addenda: 0

## 2016-07-20 NOTE — Anesthesia Preprocedure Evaluation (Signed)
Anesthesia Evaluation  Patient identified by MRN, date of birth, ID band Patient awake    Reviewed: Allergy & Precautions, H&P , NPO status , Patient's Chart, lab work & pertinent test results  History of Anesthesia Complications Negative for: history of anesthetic complications  Airway Mallampati: II  TM Distance: >3 FB Neck ROM: full    Dental no notable dental hx.    Pulmonary former smoker,    Pulmonary exam normal breath sounds clear to auscultation       Cardiovascular Normal cardiovascular exam Rhythm:regular Rate:Normal  RBBB    Neuro/Psych negative neurological ROS     GI/Hepatic negative GI ROS, Neg liver ROS,   Endo/Other  negative endocrine ROS  Renal/GU negative Renal ROS     Musculoskeletal  (+) Arthritis ,   Abdominal   Peds  Hematology negative hematology ROS (+)   Anesthesia Other Findings Chronic neck pain  Reproductive/Obstetrics negative OB ROS                             Anesthesia Physical Anesthesia Plan  ASA: II  Anesthesia Plan: MAC   Post-op Pain Management:    Induction: Intravenous  Airway Management Planned: Simple Face Mask  Additional Equipment:   Intra-op Plan:   Post-operative Plan:   Informed Consent: I have reviewed the patients History and Physical, chart, labs and discussed the procedure including the risks, benefits and alternatives for the proposed anesthesia with the patient or authorized representative who has indicated his/her understanding and acceptance.   Dental Advisory Given  Plan Discussed with: Anesthesiologist, CRNA and Surgeon  Anesthesia Plan Comments:         Anesthesia Quick Evaluation

## 2016-07-20 NOTE — Addendum Note (Signed)
Addendum  created 07/20/16 0908 by Montel Clock, CRNA   Visit Navigator Flowsheet section accepted

## 2016-07-20 NOTE — Discharge Instructions (Signed)

## 2016-07-21 ENCOUNTER — Encounter (HOSPITAL_COMMUNITY): Payer: Self-pay | Admitting: Gastroenterology

## 2016-09-06 ENCOUNTER — Encounter: Payer: Self-pay | Admitting: Family Medicine

## 2016-09-06 ENCOUNTER — Ambulatory Visit (INDEPENDENT_AMBULATORY_CARE_PROVIDER_SITE_OTHER): Payer: Medicare Other | Admitting: Family Medicine

## 2016-09-06 VITALS — BP 128/78 | HR 86 | Temp 97.5°F | Wt 129.0 lb

## 2016-09-06 DIAGNOSIS — Z23 Encounter for immunization: Secondary | ICD-10-CM

## 2016-09-06 DIAGNOSIS — L509 Urticaria, unspecified: Secondary | ICD-10-CM | POA: Diagnosis not present

## 2016-09-06 NOTE — Patient Instructions (Signed)
We will call you within a week about your referral to Dr. Neldon Mc. If you do not hear within 2 weeks, give [place] a call.   I would call Dr. Martinique to see if she can see you before January as well but if not- ok to continue use of topicals and hydroxyzine until then

## 2016-09-06 NOTE — Progress Notes (Signed)
Pre visit review using our clinic review tool, if applicable. No additional management support is needed unless otherwise documented below in the visit note. 

## 2016-09-06 NOTE — Progress Notes (Signed)
Subjective:  Rhonda [name] is a 70 y.o. year old very pleasant female patient who presents for/with See problem oriented charting ROS- no lip or tongue swelling. No shortness of breath. No fever, chills, nausea, vomiting. Denies recent new medication.see any ROS included in HPI as well.   Past Medical History-  Patient Active Problem List   Diagnosis Date Noted  . Hyperlipidemia 09/05/2006    Priority: Medium  . Osteopenia 11/15/2014    Priority: Low  . Allergic rhinitis 11/15/2014    Priority: Low  . History of basal cell cancer 11/15/2014    Priority: Low  . Rosacea     Priority: Low  . Osteoarthritis 11/18/2011    Priority: Low  . Hematuria 09/25/2008    Priority: Low  . Bilateral shoulder pain 11/15/2014    Medications- reviewed and updated Current Outpatient Prescriptions  Medication Sig Dispense Refill  . alendronate (FOSAMAX) 70 MG tablet Take 1 tablet (70 mg total) by mouth every 7 (seven) days. Take with a full glass of water on an empty stomach. 4 tablet 11  . calcium carbonate (OS-CAL) 600 MG TABS Take 600 mg by mouth 2 (two) times daily with a meal.      . cetirizine (ZYRTEC) 10 MG tablet Take 10 mg by mouth daily.      . Chlorpheniramine Maleate CR 12 MG CPCR Take 121 mg by mouth daily.      . Fish Oil-Cholecalciferol (FISH OIL + D3 PO) Take by mouth daily.    . fluocinonide cream (LIDEX) [id number] % Apply 1 application topically 2 (two) times daily.    . fluticasone (FLONASE) 50 MCG/ACT nasal spray Place 2 sprays into both nostrils daily. 16 g 6  . hydrOXYzine (VISTARIL) 25 MG capsule Take 25 mg by mouth at bedtime as needed for itching.    . Magnesium 250 MG TABS Take 1 tablet by mouth daily.      Marland Kitchen triamcinolone cream (KENALOG) 0.1 % APPLY 1 APPLICATION TOPICALLY 2 (TWO) TIMES DAILY. FOR 10 DAYS TO NECK 30 g 3   No current facility-administered medications for this visit.     Objective: BP 128/78 (BP Location: Left Arm, Patient Position: Sitting, Cuff Size:  Normal)   Pulse 86   Temp 97.5 F (36.4 C) (Oral)   Wt 129 lb (58.5 kg)   SpO2 99%   BMI 23.41 kg/m  Gen: NAD, resting comfortably No lip or tongue swelling CV: RRR no murmurs rubs or gallops Lungs: CTAB no crackles, wheeze, rhonchi Ext: no edema Skin: warm, dry, patient has multiple pink circles drawn on areas where she has had hives on back and abdomen. Only a few of these have some erythematous papules multiple within it. A few of the areas show excoriation  Assessment/Plan:  Hives - Plan: Ambulatory referral to Allergy S: Patient has been having intermittent raised red itchy areas on her back but also on our abdomen. Very pruritic raise lesions. Tried triamcinolone given for neck previously on lesions and helped some but not much. Went to dermatology Dr. Lelon Mast she thinks. Usually sees Dr. Amy Martinique (next visit in January) and was told the raise dred areas were hives. Treated with hydroxyzine prn and Fluocinonide 0.05% cream BID and has been doing this for at least a month if not longer. She uses sarma in daytime per their advise as well which helps some. She continues to get red raised itchy areas which can come and go and new spots. Previously seen by Dr.  Kozlow allergy testing years ago-shots in past. She asks about consultation with him. Hives started early July- does have new fosamax but this was started several weeks after hives started with no change in intensity since that start A/P: Hives of unclear etiology per prior dermatology diagnosis- I do not see clear hives today. I agree that allergy referral is reasonable and referral to Dr. Neldon Mc placed. We also discussed returning to her primary dermatologist for their opinion. She may need biopsy.  Considered stopping fosamax but wants to see allergist firs tbefore making this decision  Orders Placed This Encounter  Procedures  . Flu vaccine HIGH DOSE PF  . Ambulatory referral to Allergy    Referral Priority:   Routine     Referral Type:   Allergy Testing    Referral Reason:   Specialty Services Required    Requested Specialty:   Allergy    Number of Visits Requested:   1    Meds ordered this encounter  Medications  . hydrOXYzine (VISTARIL) 25 MG capsule    Sig: Take 25 mg by mouth at bedtime as needed for itching.  . fluocinonide cream (LIDEX) 0.05 %    Sig: Apply 1 application topically 2 (two) times daily.    Return precautions advised.  Garret Reddish, MD

## 2016-09-20 ENCOUNTER — Encounter: Payer: Self-pay | Admitting: Allergy and Immunology

## 2016-09-20 ENCOUNTER — Ambulatory Visit (INDEPENDENT_AMBULATORY_CARE_PROVIDER_SITE_OTHER): Payer: Medicare Other | Admitting: Allergy and Immunology

## 2016-09-20 VITALS — BP 112/70 | HR 76 | Temp 98.4°F | Resp 20 | Ht 61.18 in | Wt 129.4 lb

## 2016-09-20 DIAGNOSIS — L308 Other specified dermatitis: Secondary | ICD-10-CM

## 2016-09-20 DIAGNOSIS — L989 Disorder of the skin and subcutaneous tissue, unspecified: Secondary | ICD-10-CM

## 2016-09-20 DIAGNOSIS — L501 Idiopathic urticaria: Secondary | ICD-10-CM | POA: Diagnosis not present

## 2016-09-20 DIAGNOSIS — J3089 Other allergic rhinitis: Secondary | ICD-10-CM | POA: Diagnosis not present

## 2016-09-20 MED ORDER — METHYLPREDNISOLONE ACETATE 80 MG/ML IJ SUSP
80.0000 mg | Freq: Once | INTRAMUSCULAR | Status: AC
  Administered 2016-09-20: 80 mg via INTRAMUSCULAR

## 2016-09-20 MED ORDER — RANITIDINE HCL 150 MG PO TABS: 150.0000 mg | ORAL_TABLET | Freq: Two times a day (BID) | ORAL | Status: DC

## 2016-09-20 MED ORDER — RANITIDINE HCL 150 MG PO TABS: 150.0000 mg | ORAL_TABLET | Freq: Two times a day (BID) | ORAL | 5 refills | Status: DC

## 2016-09-20 MED ORDER — PERMETHRIN 5 % EX CREA: 1.0000 "application " | TOPICAL_CREAM | Freq: Once | CUTANEOUS | 0 refills | Status: AC

## 2016-09-20 MED ORDER — CETIRIZINE HCL 10 MG PO TABS: 10.0000 mg | ORAL_TABLET | Freq: Two times a day (BID) | ORAL | 5 refills | Status: DC

## 2016-09-20 MED ORDER — MONTELUKAST SODIUM 10 MG PO TABS: 10.0000 mg | ORAL_TABLET | Freq: Every day | ORAL | 5 refills | Status: DC

## 2016-09-20 MED ORDER — MOMETASONE FUROATE 0.1 % EX OINT: TOPICAL_OINTMENT | Freq: Every day | CUTANEOUS | 0 refills | Status: DC

## 2016-09-20 NOTE — Progress Notes (Signed)
Dear Dr. Yong Channel,  Thank you for referring Rhonda [name] to the [facility] on 09/20/2016.   Below is a summation of this patient's evaluation and recommendations.  Thank you for your referral. I will keep you informed about this patient's response to treatment.   If you have any questions please to do hesitate to contact me.   Sincerely,  Jiles Prows, MD [facility]   ______________________________________________________________________    NEW PATIENT NOTE  Referring Provider: Marin Olp, MD Primary Provider: Garret Reddish, MD Date of office visit: 09/20/2016    Subjective:   Chief Complaint:  Rhonda [name] (DOB: 08/02/1946) is a 70 y.o. female who presents to the clinic on 09/20/2016 with a chief complaint of Urticaria (red itchy skin) .     HPI: Rhonda [name] presents to this clinic in evaluation of a itchy dermatitis that has been in existence sometime since the late spring of 2017.  Rhonda [name] states that she's been itchy all over her body but especially her scalp, pubic area, shoulder, elbows, waistband, and buttocks. She will excoriate her skin intensely. She has seen a dermatologist who has given her a topical steroid and hydroxyzine and although this hydroxyzine does help her somewhat she still continues to remain intensely itchy and excoriates her skin. She has no associated systemic or constitutional symptoms. There is no obvious provoking factor giving rise to this issue. She has not had any significant environmental change and she is not using any new medications other than what has been prescribed for her pruritus.  Rhonda [name] has a history of allergic rhinoconjunctivitis and was treated with immunotherapy successfully many years ago in this clinic. She does not have any issues with asthma or food allergies or hymenoptera venom allergies.  Past Medical History:  Diagnosis Date   . Allergy   . Arthritis   . Hyperlipidemia   . Neck pain, chronic   . 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 Rosacea    metrogel in past    Past Surgical History:  Procedure Laterality Date  . bcc excision     x3  . CHOLECYSTECTOMY    . COLONOSCOPY WITH PROPOFOL N/A 07/20/2016   Procedure: COLONOSCOPY WITH PROPOFOL;  Surgeon: Garlan Fair, MD;  Location: [facility];  Service: Endoscopy;  Laterality: N/A;  . EYE SURGERY  2011   bilateral cataract with lens implant  . HYSTEROSCOPY    . mohr's surgery     face  . TONSILLECTOMY    . TUBAL LIGATION        Medication List      alendronate 70 MG tablet Commonly known as:  FOSAMAX Take 1 tablet (70 mg total) by mouth every 7 (seven) days. Take with a full glass of water on an empty stomach.   calcium carbonate 600 MG Tabs tablet Commonly known as:  OS-CAL Take 600 mg by mouth 2 (two) times daily with a meal.   cetirizine 10 MG tablet Commonly known as:  ZYRTEC Take 10 mg by mouth daily.   Chlorpheniramine Maleate CR 12 MG Cpcr Take 121 mg by mouth daily.   cholecalciferol 1000 units tablet Commonly known as:  VITAMIN D Take 1,000 Units by mouth daily.   FISH OIL + D3 PO Take by mouth daily.   fluocinonide cream 0.05 % Commonly known as:  LIDEX Apply 1 application topically 2 (  two) times daily.   fluticasone 50 MCG/ACT nasal spray Commonly known as:  FLONASE Place 2 sprays into both nostrils daily.   hydrOXYzine 25 MG capsule Commonly known as:  VISTARIL Take 25 mg by mouth at bedtime as needed for itching.   Magnesium 250 MG Tabs Take 1 tablet by mouth daily.   minoxidil 2 % external solution Commonly known as:  ROGAINE Apply topically daily.       Allergies  Allergen Reactions  . Latex Rash    Review of systems negative except as noted in HPI / PMHx or noted below:  Review of Systems  Constitutional: Negative.   HENT: Negative.   Eyes: Negative.     Respiratory: Negative.   Cardiovascular: Negative.   Gastrointestinal: Negative.   Genitourinary: Negative.   Musculoskeletal: Negative.   Skin: Negative.   Neurological: Negative.   Endo/Heme/Allergies: Negative.        Osteopenia treated with a biphosphonate  Psychiatric/Behavioral: Negative.        Death of her husband 11-22-2015 Secondary to Myocardial Infarction at the Age of 42 Undergoing Grief Counseling.    Family History  Problem Relation Age of Onset  . Alzheimer's disease Mother     died of bowel rupture  . Cancer Father     unknown  . CAD Son     49- died of MI    Social History   Social History  . Marital status: Married    Spouse name: N/A  . Number of children: N/A  . Years of education: N/A   Occupational History  . Not on file.   Social History Main Topics  . Smoking status: Former Smoker    Packs/day: 0.75    Years: 10.00    Types: Cigarettes  . Smokeless tobacco: Never Used     Comment: quit at age 42  . Alcohol use 0.0 oz/week     Comment: once a month  . Drug use: No  . Sexual activity: Not on file   Other Topics Concern  . Not on file   Social History Narrative   Married 1972. 1 living child daughter [name]. lost son 23 years old to Ali Chuk in 2017. No grandkids. 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     Environmental and Social history  Lives in a house with a dry environment, no animals located inside the household, carpeting in the bedroom, no plastic on the bed or pillow, and no smoking ongoing with inside the household  Objective:   Vitals:   09/20/16 1450  BP: 112/70  Pulse: 76  Resp: 20  Temp: 98.4 F (36.9 C)   Height: 5' 1.18" (155.4 cm) Weight: 129 lb 6.4 oz (58.7 kg)  Physical Exam  Constitutional: She is well-developed, well-nourished, and in no distress.  HENT:  Head: Normocephalic. Head is  without right periorbital erythema and without left periorbital erythema.  Right Ear: Tympanic membrane, external ear and ear canal normal.  Left Ear: Tympanic membrane, external ear and ear canal normal.  Nose: Nose normal. No mucosal edema or rhinorrhea.  Mouth/Throat: Uvula is midline, oropharynx is clear and moist and mucous membranes are normal. No oropharyngeal exudate.  Eyes: Conjunctivae and lids are normal. Pupils are equal, round, and reactive to light.  Neck: Trachea normal. No tracheal tenderness present. No tracheal deviation present. No thyromegaly present.  Cardiovascular: Normal  rate, regular rhythm, S1 normal, S2 normal and normal heart sounds.   No murmur heard. Pulmonary/Chest: Effort normal and breath sounds normal. No stridor. No tachypnea. No respiratory distress. She has no wheezes. She has no rales. She exhibits no tenderness.  Abdominal: Soft. She exhibits no distension and no mass. There is no hepatosplenomegaly. There is no tenderness. There is no rebound and no guarding.  Musculoskeletal: She exhibits no edema or tenderness.  Lymphadenopathy:       Head (right side): No tonsillar adenopathy present.       Head (left side): No tonsillar adenopathy present.    She has no cervical adenopathy.    She has no axillary adenopathy.  Neurological: She is alert. Gait normal.  Skin: Rash (Papular outbreak involving elbows bilaterally, left shoulder and involved more than right shoulder, bilateral flanks, patchy buttock involvement. All areas with slight erythema and evidence of excoriation) noted. She is not diaphoretic. No erythema. No pallor. Nails show no clubbing.  Psychiatric: Mood and affect normal.    Diagnostics: Allergy skin tests were performed. She demonstrated hypersensitivity to house dust mite but no other aeroallergens or foods.  Assessment and Plan:    1. Idiopathic urticaria   2. Inflammatory dermatosis   3. Other allergic rhinitis     1. Allergen  avoidance measures?  2. Elimite topical therapy today and repeat in 1 week  3. Depo-Medrol 80 IM delivered in clinic today  4. Utilize a combination of the following:   A. cetirizine 10 mg one tablet twice a day  B. ranitidine 150 mg one tablet twice a day  C. montelukast 10 mg one tablet one time per day  D. mometasone 0.1% ointment applied to skin twice a day  5. Continue Flonase as previously prescribed  6. May add in hydroxyzine if needed    7. Blood - celiac screen with IgA, CBC with differential, CMP, sed, TSH, T4, TP  8. Return to clinic in 3 weeks or earlier if problem  Rhonda [name] has some form of immunological hyperreactivity giving rise to inflammation of her skin. I will treat her for scabies infestation with one course of Elimite repeated in a week to clear off the table the possibility that she has a parasitic form of pruritus and inflammation. As well, we will screen her blood for a abnormality of her major organ function and to look for dermatitis herpetiformis. She will use a combination of therapy as a preventative plan stated above. I will regroup with her in approximately 3 weeks to make a decision about further evaluation and treatment pending her response.  Jiles Prows, MD [facility]

## 2016-09-20 NOTE — Patient Instructions (Addendum)
  1. Allergen avoidance measures?  2. Elimite topical therapy today and repeat in 1 week  3. Depo-Medrol 80 IM delivered in clinic today  4. Utilize a combination of the following:   A. cetirizine 10 mg one tablet twice a day  B. ranitidine 150 mg one tablet twice a day  C. montelukast 10 mg one tablet one time per day  D. mometasone 0.1% ointment applied to skin twice a day  5. Continue Flonase as previously prescribed  6. May add in hydroxyzine if needed    7. Blood - celiac screen with IgA, CBC with differential, CMP, sed, TSH, T4, TP  8. Return to clinic in 3 weeks or earlier if problem

## 2016-09-22 LAB — COMPREHENSIVE METABOLIC PANEL
ALBUMIN: 4.9 g/dL — AB (ref 3.5–4.8)
ALK PHOS: 90 IU/L (ref 39–117)
ALT: 20 IU/L (ref 0–32)
AST: 18 IU/L (ref 0–40)
Albumin/Globulin Ratio: 1.6 (ref 1.2–2.2)
BILIRUBIN TOTAL: 0.5 mg/dL (ref 0.0–1.2)
BUN / CREAT RATIO: 25 (ref 12–28)
BUN: 20 mg/dL (ref 8–27)
CHLORIDE: 102 mmol/L (ref 96–106)
CO2: 26 mmol/L (ref 18–29)
Calcium: 9.7 mg/dL (ref 8.7–10.3)
Creatinine, Ser: 0.79 mg/dL (ref 0.57–1.00)
GFR calc Af Amer: 86 mL/min/{1.73_m2} (ref >59)
GFR calc non Af Amer: 75 mL/min/{1.73_m2} (ref >59)
GLUCOSE: 94 mg/dL (ref 65–99)
Globulin, Total: 3 g/dL (ref 1.5–4.5)
Potassium: 4.1 mmol/L (ref 3.5–5.2)
Sodium: 144 mmol/L (ref 134–144)
Total Protein: 7.9 g/dL (ref 6.0–8.5)

## 2016-09-22 LAB — CBC WITH DIFFERENTIAL/PLATELET
BASOS ABS: 0 10*3/uL (ref 0.0–0.2)
Basos: 0 %
EOS (ABSOLUTE): 0.3 10*3/uL (ref 0.0–0.4)
Eos: 4 %
HEMOGLOBIN: 14.2 g/dL (ref 11.1–15.9)
Hematocrit: 42.7 % (ref 34.0–46.6)
Immature Grans (Abs): 0 10*3/uL (ref 0.0–0.1)
Immature Granulocytes: 0 %
LYMPHS ABS: 2.2 10*3/uL (ref 0.7–3.1)
Lymphs: 31 %
MCH: 29.3 pg (ref 26.6–33.0)
MCHC: 33.3 g/dL (ref 31.5–35.7)
MCV: 88 fL (ref 79–97)
MONOS ABS: 0.4 10*3/uL (ref 0.1–0.9)
Monocytes: 5 %
NEUTROS ABS: 4.1 10*3/uL (ref 1.4–7.0)
Neutrophils: 60 %
PLATELETS: 311 10*3/uL (ref 150–379)
RBC: 4.84 x10E6/uL (ref 3.77–5.28)
RDW: 13.9 % (ref 12.3–15.4)
WBC: 6.9 10*3/uL (ref 3.4–10.8)

## 2016-09-22 LAB — CELIAC PANEL 10
Antigliadin Abs, IgA: 3 units (ref 0–19)
ENDOMYSIAL IGA: NEGATIVE
GLIADIN IGG: 2 U (ref 0–19)
IGA/IMMUNOGLOBULIN A, SERUM: 195 mg/dL (ref 64–422)
Tissue Transglut Ab: 8 U/mL — ABNORMAL HIGH (ref 0–5)

## 2016-09-22 LAB — THYROID PEROXIDASE ANTIBODY: THYROID PEROXIDASE ANTIBODY: 19 [IU]/mL (ref 0–34)

## 2016-09-22 LAB — SEDIMENTATION RATE: SED RATE: 19 mm/h (ref 0–40)

## 2016-09-27 ENCOUNTER — Encounter: Payer: Self-pay | Admitting: Allergy and Immunology

## 2016-09-27 ENCOUNTER — Telehealth: Payer: Self-pay | Admitting: Allergy and Immunology

## 2016-09-27 ENCOUNTER — Ambulatory Visit (INDEPENDENT_AMBULATORY_CARE_PROVIDER_SITE_OTHER): Payer: Medicare Other | Admitting: Allergy and Immunology

## 2016-09-27 VITALS — BP 146/78 | HR 92 | Resp 20

## 2016-09-27 DIAGNOSIS — H6983 Other specified disorders of Eustachian tube, bilateral: Secondary | ICD-10-CM

## 2016-09-27 DIAGNOSIS — J3089 Other allergic rhinitis: Secondary | ICD-10-CM

## 2016-09-27 DIAGNOSIS — L308 Other specified dermatitis: Secondary | ICD-10-CM

## 2016-09-27 DIAGNOSIS — L501 Idiopathic urticaria: Secondary | ICD-10-CM

## 2016-09-27 DIAGNOSIS — L989 Disorder of the skin and subcutaneous tissue, unspecified: Secondary | ICD-10-CM

## 2016-09-27 NOTE — Telephone Encounter (Signed)
Informed patient about results.

## 2016-09-27 NOTE — Patient Instructions (Signed)
  1. Inflation of ears with positive pressure   2. Continue to Utilize a combination of the following:   A. cetirizine 10 mg one tablet twice a day  B. ranitidine 150 mg one tablet twice a day  C. montelukast 10 mg one tablet one time per day  D. mometasone 0.1% ointment applied to skin twice a day if needed  3. Continue Flonase as previously prescribed  4. May add in hydroxyzine if needed    5. Return to clinic in 3 weeks or earlier if problem

## 2016-09-27 NOTE — Telephone Encounter (Signed)
Returning a phone call from a nurse about lab results [phone]

## 2016-09-27 NOTE — Progress Notes (Signed)
Follow-up Note  Referring Provider: Marin Olp, MD Primary Provider: Garret Reddish, MD Date of Office Visit: 09/27/2016  Subjective:   Rhonda [name] (DOB: 1946/06/21) is a 70 y.o. female who returns to the [facility] on 09/27/2016 in re-evaluation of the following:  HPI: Kamarria returns to this clinic in evaluation of her itchy dermatitis. She is no longer itchy. She no longer excoriates herself. She has noticed that she's developed a little bit of ear fullness and a  crispy-like sound in both ears over the course of the past several days. She does not have a significant amount of upper airway symptoms and she's not been around anyone who has a cold.    Medication List      calcium carbonate 600 MG Tabs tablet Commonly known as:  OS-CAL Take 600 mg by mouth 2 (two) times daily with a meal.   cetirizine 10 MG tablet Commonly known as:  ZYRTEC Take 1 tablet (10 mg total) by mouth 2 (two) times daily.   cholecalciferol 1000 units tablet Commonly known as:  VITAMIN D Take 1,000 Units by mouth daily.   FISH OIL + D3 PO Take by mouth daily.   fluocinonide cream 0.05 % Commonly known as:  LIDEX Apply 1 application topically 2 (two) times daily.   fluticasone 50 MCG/ACT nasal spray Commonly known as:  FLONASE Place 2 sprays into both nostrils daily.   Magnesium 250 MG Tabs Take 1 tablet by mouth daily.   minoxidil 2 % external solution Commonly known as:  ROGAINE Apply topically daily.   mometasone 0.1 % ointment Commonly known as:  ELOCON Apply topically daily.   montelukast 10 MG tablet Commonly known as:  SINGULAIR Take 1 tablet (10 mg total) by mouth at bedtime.       Past Medical History:  Diagnosis Date  . Allergy   . Arthritis   . Hyperlipidemia   . Neck pain, chronic   . 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 Rosacea    metrogel in past    Past Surgical History:  Procedure Laterality  Date  . bcc excision     x3  . CHOLECYSTECTOMY    . COLONOSCOPY WITH PROPOFOL N/A 07/20/2016   Procedure: COLONOSCOPY WITH PROPOFOL;  Surgeon: Garlan Fair, MD;  Location: [facility];  Service: Endoscopy;  Laterality: N/A;  . EYE SURGERY  2011   bilateral cataract with lens implant  . HYSTEROSCOPY    . mohr's surgery     face  . TONSILLECTOMY    . TUBAL LIGATION      Allergies  Allergen Reactions  . Latex Rash    Review of systems negative except as noted in HPI / PMHx or noted below:  Review of Systems  Constitutional: Negative.   HENT: Negative.   Eyes: Negative.   Respiratory: Negative.   Cardiovascular: Negative.   Gastrointestinal: Negative.   Genitourinary: Negative.   Musculoskeletal: Negative.   Skin: Negative.   Neurological: Negative.   Endo/Heme/Allergies: Negative.   Psychiatric/Behavioral: Negative.      Objective:   Vitals:   09/27/16 1332  BP: (!) 146/78  Pulse: 92  Resp: 20          Physical Exam  Constitutional: She is well-developed, well-nourished, and in no distress.  HENT:  Head: Normocephalic.  Right Ear: External ear and ear canal normal. Right ear middle ear effusion: Dull light reflex with pneumatic movement.  Left Ear: External ear and ear canal normal. Left ear middle ear effusion: Dull light reflex with pneumatic movement.  Nose: Nose normal. No mucosal edema or rhinorrhea.  Mouth/Throat: Uvula is midline, oropharynx is clear and moist and mucous membranes are normal. No oropharyngeal exudate.  Eyes: Conjunctivae are normal.  Neck: Trachea normal. No tracheal tenderness present. No tracheal deviation present. No thyromegaly present.  Cardiovascular: Normal rate, regular rhythm, S1 normal, S2 normal and normal heart sounds.   No murmur heard. Pulmonary/Chest: Breath sounds normal. No stridor. No respiratory distress. She has no wheezes. She has no rales.  Musculoskeletal: She exhibits no edema.  Lymphadenopathy:        Head (right side): No tonsillar adenopathy present.       Head (left side): No tonsillar adenopathy present.    She has no cervical adenopathy.  Neurological: She is alert. Gait normal.  Skin: No rash noted. She is not diaphoretic. No erythema. Nails show no clubbing.  Psychiatric: Mood and affect normal.    Diagnostics: Results of blood tests obtained on 09/20/2016 identified normal hepatic and renal function, white blood cell count of 6.9 with normal differential, hemoglobin of 14.2 with MCV of 88 and platelet count 311. She had a negative celiac screen with an IgA level of 195 MG/DL. Sedimentation rate was 19. Thyroid peroxidase antibody was normal.   Assessment and Plan:   1. Idiopathic urticaria   2. Inflammatory dermatosis   3. Other allergic rhinitis   4. Dysfunction of both eustachian tubes     1. Inflation of ears with positive pressure   2. Continue to Utilize a combination of the following:   A. cetirizine 10 mg one tablet twice a day  B. ranitidine 150 mg one tablet twice a day  C. montelukast 10 mg one tablet one time per day  D. mometasone 0.1% ointment applied to skin twice a day if needed  3. Continue Flonase as previously prescribed  4. May add in hydroxyzine if needed    5. Return to clinic in 3 weeks or earlier if problem  I will assume that [name] has contracted a viral respiratory tract infection with ETD and have her perform positive pressure inflation of her eustachian tubes to help with some of her symptoms. I'm not going to give her any antibiotics or systemic steroids at this point in time. I will assume that she will continue to do well with the therapy described above. Certainly if she continues to have problems with her ears she'll contact me for further evaluation and treatment. Regarding her urticaria and inflammatory dermatitis, it appears to almost completely resolved with therapy as has her pruritus and thus she'll remain on therapy and I'll see her  back in this clinic in 3 weeks or earlier if there is a problem.  [name], MD [place]

## 2016-09-30 ENCOUNTER — Other Ambulatory Visit: Payer: Self-pay | Admitting: Allergy and Immunology

## 2016-10-13 ENCOUNTER — Encounter: Payer: Self-pay | Admitting: Allergy and Immunology

## 2016-10-13 ENCOUNTER — Ambulatory Visit (INDEPENDENT_AMBULATORY_CARE_PROVIDER_SITE_OTHER): Payer: Medicare Other | Admitting: Allergy and Immunology

## 2016-10-13 VITALS — BP 132/70 | HR 80 | Resp 18

## 2016-10-13 DIAGNOSIS — J3089 Other allergic rhinitis: Secondary | ICD-10-CM | POA: Diagnosis not present

## 2016-10-13 DIAGNOSIS — L308 Other specified dermatitis: Secondary | ICD-10-CM | POA: Diagnosis not present

## 2016-10-13 DIAGNOSIS — H6983 Other specified disorders of Eustachian tube, bilateral: Secondary | ICD-10-CM | POA: Diagnosis not present

## 2016-10-13 DIAGNOSIS — L501 Idiopathic urticaria: Secondary | ICD-10-CM | POA: Diagnosis not present

## 2016-10-13 DIAGNOSIS — L989 Disorder of the skin and subcutaneous tissue, unspecified: Secondary | ICD-10-CM

## 2016-10-13 NOTE — Patient Instructions (Signed)
  1. Can continue to use Inflation of ears with positive pressure. ENT evaluation?  2. Continue to Utilize a combination of the following:   A. cetirizine 10 mg one tablet twice a day  B. ranitidine 150 mg one tablet twice a day  C. montelukast 10 mg one tablet one time per day  D. mometasone 0.1% ointment applied to skin twice a day if needed  3. Continue Flonase as previously prescribed  4. Stop fish oil use.   5. Can add diphenhydramine 25-50 milligrams every 6 or 8 hours if needed  6. Return to clinic in 12 weeks or earlier if problem

## 2016-10-13 NOTE — Progress Notes (Signed)
Follow-up Note  Referring Provider: Marin Olp, MD Primary Provider: Garret Reddish, MD Date of Office Visit: 10/13/2016  Subjective:   Rhonda [name] (DOB: March 26, 1946) is a 70 y.o. female who returns to the [facility] on 10/13/2016 in re-evaluation of the following:  HPI: Rhonda [name] returns to this clinic in evaluation of her itchy dermatitis with component of asthma and inflammatory dermatosis and an issue with her ears. I last saw her in his clinic on 09/27/2016. She's really done a lot better on her current therapy. She only has an itchy spot around her elbows and occasionally on her hands and very slightly on her face. She's been using all of her medical therapy and overall feels really good. Her ear fullness is not a particularly big issue at this point and she no longer hears these crispy like sounds in her ears. She's been using positive pressure inflation of her ears occasionally. She continues to use all of her medications as previously prescribed.    Medication List      calcium carbonate 600 MG Tabs tablet Commonly known as:  OS-CAL Take 600 mg by mouth 2 (two) times daily with a meal.   cetirizine 10 MG tablet Commonly known as:  ZYRTEC Take 1 tablet (10 mg total) by mouth 2 (two) times daily.   cholecalciferol 1000 units tablet Commonly known as:  VITAMIN D Take 1,000 Units by mouth daily.   FISH OIL + D3 PO Take by mouth daily.   fluocinonide cream 0.05 % Commonly known as:  LIDEX Apply 1 application topically 2 (two) times daily.   fluticasone 50 MCG/ACT nasal spray Commonly known as:  FLONASE Place 2 sprays into both nostrils daily.   Magnesium 250 MG Tabs Take 1 tablet by mouth daily.   minoxidil 2 % external solution Commonly known as:  ROGAINE Apply topically daily.   mometasone 0.1 % ointment Commonly known as:  ELOCON Apply to affected areas once daily as directed   montelukast 10 MG tablet Commonly known as:   SINGULAIR Take 1 tablet (10 mg total) by mouth at bedtime.   ranitidine 150 MG tablet Commonly known as:  ZANTAC       Past Medical History:  Diagnosis Date  . Allergy   . Arthritis   . Hyperlipidemia   . Neck pain, chronic   . 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 Rosacea    metrogel in past    Past Surgical History:  Procedure Laterality Date  . bcc excision     x3  . CHOLECYSTECTOMY    . COLONOSCOPY WITH PROPOFOL N/A 07/20/2016   Procedure: COLONOSCOPY WITH PROPOFOL;  Surgeon: Garlan Fair, MD;  Location: [facility];  Service: Endoscopy;  Laterality: N/A;  . EYE SURGERY  2011   bilateral cataract with lens implant  . HYSTEROSCOPY    . mohr's surgery     face  . TONSILLECTOMY    . TUBAL LIGATION      Allergies  Allergen Reactions  . Latex Rash    Review of systems negative except as noted in HPI / PMHx or noted below:  Review of Systems  Constitutional: Negative.   HENT: Negative.   Eyes: Negative.   Respiratory: Negative.   Cardiovascular: Negative.   Gastrointestinal: Negative.   Genitourinary: Negative.   Musculoskeletal: Negative.   Skin: Negative.   Neurological: Negative.   Endo/Heme/Allergies: Negative.   Psychiatric/Behavioral: Negative.  Objective:   Vitals:   10/13/16 1045  BP: 132/70  Pulse: 80  Resp: 18          Physical Exam  Constitutional: She is well-developed, well-nourished, and in no distress.  HENT:  Head: Normocephalic.  Right Ear: Tympanic membrane, external ear and ear canal normal. Right ear middle ear effusion: dull light reflex.  Left Ear: Tympanic membrane, external ear and ear canal normal. Left ear middle ear effusion:  dull light reflex.  Nose: Nose normal. No mucosal edema or rhinorrhea.  Mouth/Throat: Uvula is midline, oropharynx is clear and moist and mucous membranes are normal. No oropharyngeal exudate.  Eyes: Conjunctivae are normal.  Neck: Trachea normal.  No tracheal tenderness present. No tracheal deviation present. No thyromegaly present.  Cardiovascular: Normal rate, regular rhythm, S1 normal, S2 normal and normal heart sounds.   No murmur heard. Pulmonary/Chest: Breath sounds normal. No stridor. No respiratory distress. She has no wheezes. She has no rales.  Musculoskeletal: She exhibits no edema.  Lymphadenopathy:       Head (right side): No tonsillar adenopathy present.       Head (left side): No tonsillar adenopathy present.    She has no cervical adenopathy.  Neurological: She is alert. Gait normal.  Skin: Rash (very slight erythema at elbow bilaterally. No significant dermatitis affecting hands.) noted. She is not diaphoretic. No erythema. Nails show no clubbing.  Psychiatric: Mood and affect normal.    Diagnostics: None  Assessment and Plan:   1. Idiopathic urticaria   2. Inflammatory dermatosis   3. Other allergic rhinitis   4. Dysfunction of both eustachian tubes     1. Can continue to use Inflation of ears with positive pressure. ENT evaluation?  2. Continue to Utilize a combination of the following:   A. cetirizine 10 mg one tablet twice a day  B. ranitidine 150 mg one tablet twice a day  C. montelukast 10 mg one tablet one time per day  D. mometasone 0.1% ointment applied to skin twice a day if needed  3. Continue Flonase as previously prescribed  4. Stop fish oil use.   5. Can add diphenhydramine 25-50 milligrams every 6 or 8 hours if needed  6. Return to clinic in 12 weeks or earlier if problem  I think that Rhonda [name] is doing better on her current plan and we'll continue to have her use this plan and see her back in this clinic in 12 weeks. I am going to stop her fish oil as this may be a precipitant for giving rise to her immunological hyperreactivity manifested as her inflammatory dermatosis and urticaria. She has the option of adding in diphenhydramine if needed. I did have a talk with her today about her  ETD and should she find that this become [place] more significant issue in the future we'll refer her back to an ENT physician.  [name], MD [place]

## 2016-10-19 ENCOUNTER — Ambulatory Visit: Payer: Medicare PPO | Admitting: Allergy and Immunology

## 2016-11-29 HISTORY — PX: KNEE SURGERY: SHX244

## 2016-12-03 ENCOUNTER — Ambulatory Visit (INDEPENDENT_AMBULATORY_CARE_PROVIDER_SITE_OTHER): Payer: Medicare Other

## 2016-12-03 VITALS — BP 120/70 | HR 88 | Ht 62.5 in | Wt 130.0 lb

## 2016-12-03 DIAGNOSIS — Z0000 Encounter for general adult medical examination without abnormal findings: Secondary | ICD-10-CM

## 2016-12-03 NOTE — Patient Instructions (Addendum)
Rhonda [name] , Thank you for taking time to come for your Medicare Wellness Visit. I appreciate your ongoing commitment to your health goals. Please review the following plan we discussed and let me know if I can assist you in the future.   Discussed fup with GYN in Feb; Paula Compton ) regarding the dexa and tx for osteopenia 1296m of calcium is the limit of what you need   Will start fosamax soon   Keep exercising   Stay tuned to new shingles vaccination      These are the goals we discussed: Goals    . patient          Can visit the osteoporosis foundation [email or web address]  Will try to start fosamax on Wed/   Watch prolia ( have a conversation with the doctor )  Will ask Dr. RMarvel Planas well; her gyn;   Calcium around 1200 mg (supplement of food)  Top 10 Calcium Rich Foods  1) Raw Milk. 1 cup: 300 mg (30% DV)  2) Kale (cooked) 1 cup: 245 mg (24% DV)  3) Sardines (with bones) 2 ounces: 217 mg (21% DV)  4) Yogurt or Kefir. 6 oz: 300 mg (30% DV)  5) Broccoli. 1  cup cooked: 93 mg (9% DV)  6) Watercress. 1 cup: 41 mg (4% DV)  7) Cheese. 1 oz: 224 mg (22% DV)  8) Bok Choy.        This is a list of the screening recommended for you and due dates:  Health Maintenance  Topic Date Due  . Mammogram  02/05/2017  . Tetanus Vaccine  09/25/2018  . Colon Cancer Screening  07/20/2021  . Flu Shot  Completed  . DEXA scan (bone density measurement)  Completed  . Shingles Vaccine  Completed  .  Hepatitis C: One time screening is recommended by Center for Disease Control  (CDC) for  adults born from [birth year]through 1965.   Completed  . Pneumonia vaccines  Completed             Fall Prevention in the Home Introduction Falls can cause injuries. They can happen to people of all ages. There are many things you can do to make your home safe and to help prevent falls. What can I do on the outside of my home?  Regularly fix the edges of walkways and driveways and fix any  cracks.  Remove anything that might make you trip as you walk through a door, such as a raised step or threshold.  Trim any bushes or trees on the path to your home.  Use bright outdoor lighting.  Clear any walking paths of anything that might make someone trip, such as rocks or tools.  Regularly check to see if handrails are loose or broken. Make sure that both sides of any steps have handrails.  Any raised decks and porches should have guardrails on the edges.  Have any leaves, snow, or ice cleared regularly.  Use sand or salt on walking paths during winter.  Clean up any spills in your garage right away. This includes oil or grease spills. What can I do in the bathroom?  Use night lights.  Install grab bars by the toilet and in the tub and shower. Do not use towel bars as grab bars.  Use non-skid mats or decals in the tub or shower.  If you need to sit down in the shower, use a plastic, non-slip stool.  Keep  the floor dry. Clean up any water that spills on the floor as soon as it happens.  Remove soap buildup in the tub or shower regularly.  Attach bath mats securely with double-sided non-slip rug tape.  Do not have throw rugs and other things on the floor that can make you trip. What can I do in the bedroom?  Use night lights.  Make sure that you have a light by your bed that is easy to reach.  Do not use any sheets or blankets that are too big for your bed. They should not hang down onto the floor.  Have a firm chair that has side arms. You can use this for support while you get dressed.  Do not have throw rugs and other things on the floor that can make you trip. What can I do in the kitchen?  Clean up any spills right away.  Avoid walking on wet floors.  Keep items that you use a lot in easy-to-reach places.  If you need to reach something above you, use a strong step stool that has a grab bar.  Keep electrical cords out of the way.  Do not use floor  polish or wax that makes floors slippery. If you must use wax, use non-skid floor wax.  Do not have throw rugs and other things on the floor that can make you trip. What can I do with my stairs?  Do not leave any items on the stairs.  Make sure that there are handrails on both sides of the stairs and use them. Fix handrails that are broken or loose. Make sure that handrails are as long as the stairways.  Check any carpeting to make sure that it is firmly attached to the stairs. Fix any carpet that is loose or worn.  Avoid having throw rugs at the top or bottom of the stairs. If you do have throw rugs, attach them to the floor with carpet tape.  Make sure that you have a light switch at the top of the stairs and the bottom of the stairs. If you do not have them, ask someone to add them for you. What else can I do to help prevent falls?  Wear shoes that:  Do not have high heels.  Have rubber bottoms.  Are comfortable and fit you well.  Are closed at the toe. Do not wear sandals.  If you use a stepladder:  Make sure that it is fully opened. Do not climb a closed stepladder.  Make sure that both sides of the stepladder are locked into place.  Ask someone to hold it for you, if possible.  Clearly mark and make sure that you can see:  Any grab bars or handrails.  First and last steps.  Where the edge of each step is.  Use tools that help you move around (mobility aids) if they are needed. These include:  Canes.  Walkers.  Scooters.  Crutches.  Turn on the lights when you go into a dark area. Replace any light bulbs as soon as they burn out.  Set up your furniture so you have a clear path. Avoid moving your furniture around.  If any of your floors are uneven, fix them.  If there are any pets around you, be aware of where they are.  Review your medicines with your doctor. Some medicines can make you feel dizzy. This can increase your chance of falling. Ask your  doctor what other things that you can do  to help prevent falls. This information is not intended to replace advice given to you by your health care provider. Make sure you discuss any questions you have with your health care provider. Document Released: 09/11/2009 Document Revised: 04/22/2016 Document Reviewed: 12/20/2014  2017 [employer]  Health Maintenance, Female Introduction Adopting a healthy lifestyle and getting preventive care can go a long way to promote health and wellness. Talk with your health care provider about what schedule of regular examinations is right for you. This is a good chance for you to check in with your provider about disease prevention and staying healthy. In between checkups, there are plenty of things you can do on your own. Experts have done a lot of research about which lifestyle changes and preventive measures are most likely to keep you healthy. Ask your health care provider for more information. Weight and diet Eat a healthy diet  Be sure to include plenty of vegetables, fruits, low-fat dairy products, and lean protein.  Do not eat a lot of foods high in solid fats, added sugars, or salt.  Get regular exercise. This is one of the most important things you can do for your health.  Most adults should exercise for at least 150 minutes each week. The exercise should increase your heart rate and make you sweat (moderate-intensity exercise).  Most adults should also do strengthening exercises at least twice a week. This is in addition to the moderate-intensity exercise. Maintain a healthy weight  Body mass index (BMI) is a measurement that can be used to identify possible weight problems. It estimates body fat based on height and weight. Your health care provider can help determine your BMI and help you achieve or maintain a healthy weight.  For females 64 years of age and older:  A BMI below 18.5 is considered underweight.  A BMI of 18.5 to 24.9 is  normal.  A BMI of 25 to 29.9 is considered overweight.  A BMI of 30 and above is considered obese. Watch levels of cholesterol and blood lipids  You should start having your blood tested for lipids and cholesterol at 73 years of age, then have this test every 5 years.  You may need to have your cholesterol levels checked more often if:  Your lipid or cholesterol levels are high.  You are older than 73 years of age.  You are at high risk for heart disease. Cancer screening Lung Cancer  Lung cancer screening is recommended for adults 75-69 years old who are at high risk for lung cancer because of a history of smoking.  A yearly low-dose CT scan of the lungs is recommended for people who:  Currently smoke.  Have quit within the past 15 years.  Have at least a 30-pack-year history of smoking. A pack year is smoking an average of one pack of cigarettes a day for 1 year.  Yearly screening should continue until it has been 15 years since you quit.  Yearly screening should stop if you develop a health problem that would prevent you from having lung cancer treatment. Breast Cancer  Practice breast self-awareness. This means understanding how your breasts normally appear and feel.  It also means doing regular breast self-exams. Let your health care provider know about any changes, no matter how small.  If you are in your 20s or 30s, you should have a clinical breast exam (CBE) by a health care provider every 1-3 years as part of a regular health exam.  If you are  3 or older, have a CBE every year. Also consider having a breast X-ray (mammogram) every year.  If you have a family history of breast cancer, talk to your health care provider about genetic screening.  If you are at high risk for breast cancer, talk to your health care provider about having an MRI and a mammogram every year.  Breast cancer gene (BRCA) assessment is recommended for women who have family members with  BRCA-related cancers. BRCA-related cancers include:  Breast.  Ovarian.  Tubal.  Peritoneal cancers.  Results of the assessment will determine the need for genetic counseling and BRCA1 and BRCA2 testing. Cervical Cancer  Your health care provider may recommend that you be screened regularly for cancer of the pelvic organs (ovaries, uterus, and vagina). This screening involves a pelvic examination, including checking for microscopic changes to the surface of your cervix (Pap test). You may be encouraged to have this screening done every 3 years, beginning at age 45.  For women ages 4-65, health care providers may recommend pelvic exams and Pap testing every 3 years, or they may recommend the Pap and pelvic exam, combined with testing for human papilloma virus (HPV), every 5 years. Some types of HPV increase your risk of cervical cancer. Testing for HPV may also be done on women of any age with unclear Pap test results.  Other health care providers may not recommend any screening for nonpregnant women who are considered low risk for pelvic cancer and who do not have symptoms. Ask your health care provider if a screening pelvic exam is right for you.  If you have had past treatment for cervical cancer or a condition that could lead to cancer, you need Pap tests and screening for cancer for at least 20 years after your treatment. If Pap tests have been discontinued, your risk factors (such as having a new sexual partner) need to be reassessed to determine if screening should resume. Some women have medical problems that increase the chance of getting cervical cancer. In these cases, your health care provider may recommend more frequent screening and Pap tests. Colorectal Cancer  This type of cancer can be detected and often prevented.  Routine colorectal cancer screening usually begins at 73 years of age and continues through 73 years of age.  Your health care provider may recommend screening at  an earlier age if you have risk factors for colon cancer.  Your health care provider may also recommend using home test kits to check for hidden blood in the stool.  A small camera at the end of a tube can be used to examine your colon directly (sigmoidoscopy or colonoscopy). This is done to check for the earliest forms of colorectal cancer.  Routine screening usually begins at age 34.  Direct examination of the colon should be repeated every 5-10 years through 73 years of age. However, you may need to be screened more often if early forms of precancerous polyps or small growths are found. Skin Cancer  Check your skin from head to toe regularly.  Tell your health care provider about any new moles or changes in moles, especially if there is a change in a mole's shape or color.  Also tell your health care provider if you have a mole that is larger than the size of a pencil eraser.  Always use sunscreen. Apply sunscreen liberally and repeatedly throughout the day.  Protect yourself by wearing long sleeves, pants, a wide-brimmed hat, and sunglasses whenever you are outside.  Heart disease, diabetes, and high blood pressure  High blood pressure causes heart disease and increases the risk of stroke. High blood pressure is more likely to develop in:  People who have blood pressure in the high end of the normal range (130-139/85-89 mm Hg).  People who are overweight or obese.  People who are African American.  If you are 58-61 years of age, have your blood pressure checked every 3-5 years. If you are 31 years of age or older, have your blood pressure checked every year. You should have your blood pressure measured twice-once when you are at a hospital or clinic, and once when you are not at a hospital or clinic. Record the average of the two measurements. To check your blood pressure when you are not at a hospital or clinic, you can use:  An automated blood pressure machine at a pharmacy.  A  home blood pressure monitor.  If you are between 44 years and 33 years old, ask your health care provider if you should take aspirin to prevent strokes.  Have regular diabetes screenings. This involves taking a blood sample to check your fasting blood sugar level.  If you are at a normal weight and have a low risk for diabetes, have this test once every three years after 73 years of age.  If you are overweight and have a high risk for diabetes, consider being tested at a younger age or more often. Preventing infection Hepatitis B  If you have a higher risk for hepatitis B, you should be screened for this virus. You are considered at high risk for hepatitis B if:  You were born in a country where hepatitis B is common. Ask your health care provider which countries are considered high risk.  Your parents were born in a high-risk country, and you have not been immunized against hepatitis B (hepatitis B vaccine).  You have HIV or AIDS.  You use needles to inject street drugs.  You live with someone who has hepatitis B.  You have had sex with someone who has hepatitis B.  You get hemodialysis treatment.  You take certain medicines for conditions, including cancer, organ transplantation, and autoimmune conditions. Hepatitis C  Blood testing is recommended for:  Everyone born from [birth year] through 1967.  Anyone with known risk factors for hepatitis C. Sexually transmitted infections (STIs)  You should be screened for sexually transmitted infections (STIs) including gonorrhea and chlamydia if:  You are sexually active and are younger than 73 years of age.  You are older than 73 years of age and your health care provider tells you that you are at risk for this type of infection.  Your sexual activity has changed since you were last screened and you are at an increased risk for chlamydia or gonorrhea. Ask your health care provider if you are at risk.  If you do not have HIV, but are  at risk, it may be recommended that you take a prescription medicine daily to prevent HIV infection. This is called pre-exposure prophylaxis (PrEP). You are considered at risk if:  You are sexually active and do not regularly use condoms or know the HIV status of your partner(s).  You take drugs by injection.  You are sexually active with a partner who has HIV. Talk with your health care provider about whether you are at high risk of being infected with HIV. If you choose to begin PrEP, you should first be tested for HIV. You should then  be tested every 3 months for as long as you are taking PrEP. Pregnancy  If you are premenopausal and you may become pregnant, ask your health care provider about preconception counseling.  If you may become pregnant, take 400 to 800 micrograms (mcg) of folic acid every day.  If you want to prevent pregnancy, talk to your health care provider about birth control (contraception). Osteoporosis and menopause  Osteoporosis is a disease in which the bones lose minerals and strength with aging. This can result in serious bone fractures. Your risk for osteoporosis can be identified using a bone density scan.  If you are 74 years of age or older, or if you are at risk for osteoporosis and fractures, ask your health care provider if you should be screened.  Ask your health care provider whether you should take a calcium or vitamin D supplement to lower your risk for osteoporosis.  Menopause may have certain physical symptoms and risks.  Hormone replacement therapy may reduce some of these symptoms and risks. Talk to your health care provider about whether hormone replacement therapy is right for you. Follow these instructions at home:  Schedule regular health, dental, and eye exams.  Stay current with your immunizations.  Do not use any tobacco products including cigarettes, chewing tobacco, or electronic cigarettes.  If you are pregnant, do not drink  alcohol.  If you are breastfeeding, limit how much and how often you drink alcohol.  Limit alcohol intake to no more than 1 drink per day for nonpregnant women. One drink equals 12 ounces of beer, 5 ounces of wine, or 1 ounces of hard liquor.  Do not use street drugs.  Do not share needles.  Ask your health care provider for help if you need support or information about quitting drugs.  Tell your health care provider if you often feel depressed.  Tell your health care provider if you have ever been abused or do not feel safe at home. This information is not intended to replace advice given to you by your health care provider. Make sure you discuss any questions you have with your health care provider. Document Released: 05/31/2011 Document Revised: 04/22/2016 Document Reviewed: 08/19/2015  2017 [employer]

## 2016-12-03 NOTE — Progress Notes (Signed)
Subjective:   Rhonda [name] is a 71 y.o. female who presents for Medicare Annual (Subsequent) preventive examination.  The Patient was informed that the wellness visit is to identify future health risk and educate and initiate measures that can reduce risk for increased disease through the lifespan.    NO ROS; Medicare Wellness Visit  Psychosocial Married 38'  dtr living; son deceased to MI  Secretary at school   Describes health as good, fair or great? Good   Preventive Screening -Counseling & Management  Preventive exam 04/2016 HM up to date Colonoscopy 06/2016 - had some polyps repeat in 5 years  Mammogram 01/2015/ 01/2016; and plans to have one this year Bone Density 05/2016  -2.1 /  Went off fosamax; discussed with Dr. Yong Channel plans to start back  But may discuss also with GYN since apt is in Feb.  Pap q 3 years    Will plan  To start back on fosamax; start date to be determined    Smoking history/ former smoker with 7.5 pack years  Second Hand Smoke status; No Smokers in the home  ETOH rarely; glass of wine or gin and tonic at hs/ one drink only;educated on 2 oz limit   RISK FACTORS Regular exercise [facility]ob[facility] 40 minutes on elliptical most days   Diet Eats 3 meals;  Breakfast cheerios and banana Lunch; yogurt or apple or pear Peanut butter sandwich Supper; spouse does a  Lot of the cooking Salad; meat and vegetable with the salad   Fall risk no Mobility of Functional changes this year? no  Cardiac Risk Factors:  Advanced aged  >53 in women Hyperlipidemia/ chol 202; HDL 64; LDL 115; trig 116  Diabetes neg Family History (father had cancer)  Obesity neg; 21   Eye exam; no issues to date x for wrinkle (mole) in eye Dr. Susa Simmonds and Dr. Kevan Ny for  Eye exams  Depression Screen Is not depressed buost still grieving over the loss of her son  PhQ 2: negative  Activities of Daily Living - See functional  screen   Cognitive testing;  Mother had Alz;  Ad8 score; 0 or less than 2  MMSE deferred or completed if AD8 + 2 issues  Advan/ced Directives yes and noted it is scanned to the record  08/ 2017  Patient Care Team: Marin Olp, MD as PCP - General (Family Medicine) Ophthalmologist listed above Dr. [facility]  Immunization History  Administered Date(s) Administered  . Influenza Split 10/04/2011, 09/12/2012  . Influenza Whole 10/22/2009, 09/03/2010  . Influenza, High Dose Seasonal PF 09/05/2013, 09/12/2015, 09/06/2016  . Influenza,inj,Quad PF,36+ Mos 09/06/2014  . Pneumococcal Conjugate-13 09/06/2014  . Pneumococcal Polysaccharide-23 10/22/2009  . Td 09/25/2008   Required Immunizations needed today  Screening test up to date or reviewed for plan of completion There are no preventive care reminders to display for this patient.  States she took shingles a long time ago Discussed 2018 vaccines and may repeat if she desires too   Questioned Hepatis c and noted this was checked and is neg         Objective:     Vitals: BP 120/70   Pulse 88   Ht 5' 2.5" (1.588 m)   Wt 130 lb (59 kg)   SpO2 94%   BMI 23.40 kg/m   Body mass index is 23.4 kg/m.   Tobacco History  Smoking Status  .  Former Smoker  . Packs/day: 0.75  . Years: 10.00  . Types: Cigarettes  Smokeless Tobacco  . Never Used    Comment: quit at age 52     Counseling given: Yes   Past Medical History:  Diagnosis Date  . Allergy   . Arthritis   . Hyperlipidemia   . Neck pain, chronic   . 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 Rosacea    metrogel in past   Past Surgical History:  Procedure Laterality Date  . bcc excision     x3  . CHOLECYSTECTOMY    . COLONOSCOPY WITH PROPOFOL N/A 07/20/2016   Procedure: COLONOSCOPY WITH PROPOFOL;  Surgeon: Garlan Fair, MD;  Location: [facility];  Service: Endoscopy;  Laterality: N/A;  . EYE SURGERY  2011     bilateral cataract with lens implant  . HYSTEROSCOPY    . mohr's surgery     face  . TONSILLECTOMY    . TUBAL LIGATION     Family History  Problem Relation Age of Onset  . Alzheimer's disease Mother     died of bowel rupture  . Cancer Father     unknown  . CAD Son     15- died of MI   History  Sexual Activity  . Sexual activity: Not on file    Outpatient Encounter Prescriptions as of 12/03/2016  Medication Sig  . calcium carbonate (OS-CAL) 600 MG TABS Take 600 mg by mouth 2 (two) times daily with a meal.    . cholecalciferol (VITAMIN D) 1000 units tablet Take 1,000 Units by mouth daily.  Marland Kitchen doxepin (SINEQUAN) 10 MG capsule Take 10 mg by mouth.  . Fish Oil-Cholecalciferol (FISH OIL + D3 PO) Take by mouth daily.  . fluticasone (FLONASE) 50 MCG/ACT nasal spray Place 2 sprays into both nostrils daily.  Marland Kitchen loratadine (CLARITIN) 10 MG tablet Take 10 mg by mouth daily.  . Magnesium 250 MG TABS Take 1 tablet by mouth daily.    . minoxidil (ROGAINE) 2 % external solution Apply topically daily.  . cetirizine (ZYRTEC) 10 MG tablet Take 1 tablet (10 mg total) by mouth 2 (two) times daily. (Patient not taking: Reported on 12/03/2016)  . fluocinonide cream (LIDEX) [id number] % Apply 1 application topically 2 (two) times daily.  . mometasone (ELOCON) 0.1 % ointment Apply to affected areas once daily as directed (Patient not taking: Reported on 12/03/2016)  . montelukast (SINGULAIR) 10 MG tablet Take 1 tablet (10 mg total) by mouth at bedtime. (Patient not taking: Reported on 12/03/2016)  . ranitidine (ZANTAC) 150 MG tablet    Facility-Administered Encounter Medications as of 12/03/2016  Medication  . ranitidine (ZANTAC) tablet 150 mg    Activities of Daily Living In your present state of health, do you have any difficulty performing the following activities: 12/03/2016  Hearing? Y  Vision? Y  Difficulty concentrating or making decisions? N  Walking or climbing stairs? N  Dressing or bathing? N   Doing errands, shopping? N  Preparing Food and eating ? N  Using the Toilet? N  In the past six months, have you accidently leaked urine? N  Do you have problems with loss of bowel control? N  Managing your Medications? N  Managing your Finances? N  Housekeeping or managing your Housekeeping? N  Some recent data might be hidden    Patient Care Team: Marin Olp, MD as PCP - General (Family Medicine)    Assessment:  Exercise Activities and Dietary recommendations Deep water aerobics and elliptical at home     Goals    . patient          Can visit the osteoporosis foundation .org  Will try to start fosamax on Wed/   Watch prolia ( have a conversation with the doctor )  Will ask Dr. Marvel Plan as well; her gyn;   Calcium around 1200 mg (supplement of food)  Top 10 Calcium Rich Foods  1) Raw Milk. 1 cup: 300 mg (30% DV)  2) Kale (cooked) 1 cup: 245 mg (24% DV)  3) Sardines (with bones) 2 ounces: 217 mg (21% DV)  4) Yogurt or Kefir. 6 oz: 300 mg (30% DV)  5) Broccoli. 1  cup cooked: 93 mg (9% DV)  6) Watercress. 1 cup: 41 mg (4% DV)  7) Cheese. 1 oz: 224 mg (22% DV)  8) Bok Choy.       Fall Risk Fall Risk  12/03/2016 05/19/2016 05/13/2015 04/15/2014  Falls in the past year? No No No No  Risk for fall due to : - - - History of fall(s)   Depression Screen PHQ 2/9 Scores 12/03/2016 05/19/2016 05/13/2015 04/15/2014  PHQ - 2 Score 0 0 0 0     Cognitive Function MMSE - Mini Mental State Exam 12/03/2016  Not completed: (No Data)    no issues to date    Immunization History  Administered Date(s) Administered  . Influenza Split 10/04/2011, 09/12/2012  . Influenza Whole 10/22/2009, 09/03/2010  . Influenza, High Dose Seasonal PF 09/05/2013, 09/12/2015, 09/06/2016  . Influenza,inj,Quad PF,36+ Mos 09/06/2014  . Pneumococcal Conjugate-13 09/06/2014  . Pneumococcal Polysaccharide-23 10/22/2009  . Td 09/25/2008   Screening Tests Health Maintenance  Topic Date Due   . MAMMOGRAM  02/05/2017  . TETANUS/TDAP  09/25/2018  . COLONOSCOPY  07/20/2021  . INFLUENZA VACCINE  Completed  . DEXA SCAN  Completed  . ZOSTAVAX  Completed  . Hepatitis C Screening  Completed  . PNA vac Low Risk Adult  Completed      Plan:      PCP Notes  Health Maintenance Will start Fosamax again; to also discuss with Dr. Paula [facility] her GYN:  Given resources and discussed Calcium intake of 1200 mg via food or supplement   Abnormal Screens none today  Referrals; no   Patient concerns; discussed osteopenia and given resource of the osteoporosis foundation for more information  Nurse Concerns; none  Next PCP apt to be scheduled Was just in;  States she was started on 10mg  of doxepin and her rash cleared.     During the course of the visit the patient was educated and counseled about the following appropriate screening and preventive services:   Vaccines to include Pneumoccal, Influenza, Hepatitis B, Td, Zostavax, HCV  Electrocardiogram  Cardiovascular Disease  Colorectal cancer screening q 5 years 2022  Bone density screening/ 05/2016  Diabetes screening neg  Glaucoma screening neg per her self report   Mammography/did have one last year ? Around March and will have one this year   Nutrition counseling   Patient Instructions (the written plan) was given to the patient.   Wynetta Fines, RN  12/03/2016

## 2016-12-04 NOTE — Progress Notes (Signed)
I have reviewed and agree with note, evaluation, plan.   Jazara Swiney, MD  

## 2017-01-11 ENCOUNTER — Ambulatory Visit (INDEPENDENT_AMBULATORY_CARE_PROVIDER_SITE_OTHER): Payer: Medicare Other | Admitting: Allergy and Immunology

## 2017-01-11 ENCOUNTER — Encounter: Payer: Self-pay | Admitting: Allergy and Immunology

## 2017-01-11 ENCOUNTER — Other Ambulatory Visit: Payer: Self-pay | Admitting: *Deleted

## 2017-01-11 VITALS — BP 122/78 | HR 68 | Resp 16

## 2017-01-11 DIAGNOSIS — H6983 Other specified disorders of Eustachian tube, bilateral: Secondary | ICD-10-CM | POA: Diagnosis not present

## 2017-01-11 DIAGNOSIS — L989 Disorder of the skin and subcutaneous tissue, unspecified: Secondary | ICD-10-CM

## 2017-01-11 DIAGNOSIS — L308 Other specified dermatitis: Secondary | ICD-10-CM

## 2017-01-11 DIAGNOSIS — J3089 Other allergic rhinitis: Secondary | ICD-10-CM | POA: Diagnosis not present

## 2017-01-11 DIAGNOSIS — H6993 Unspecified Eustachian tube disorder, bilateral: Secondary | ICD-10-CM

## 2017-01-11 MED ORDER — MONTELUKAST SODIUM 10 MG PO TABS: ORAL_TABLET | ORAL | 1 refills | Status: DC

## 2017-01-11 NOTE — Patient Instructions (Addendum)
  1. Can continue to use Inflation of ears with positive pressure.    2. Continue to Utilize a combination of the following:   A. Claritin 10 mg in AM  B. Doxepin 10mg  in PM  C. montelukast 10 mg one tablet one time per day  D. Topical steroid if needed  3. Continue Flonase as previously prescribed  4. Stop fish oil use.   5. Return to clinic in 6 months or earlier if problem

## 2017-01-11 NOTE — Progress Notes (Signed)
Follow-up Note  Referring Provider: Marin Olp, MD Primary Provider: Garret Reddish, MD Date of Office Visit: 01/11/2017  Subjective:   Rhonda VIOLETT (DOB: [birth year]) is a 71 y.o. female who returns to the [facility] on 01/11/2017 in re-evaluation of the following:  HPI: Rhonda [name] presents to this clinic in reevaluation of her pruritic dermatitis, allergic rhinitis, ETD, and a history of distant asthma. I've not seen her in this clinic since November 2017.  She continued to have problems with inflammatory dermatosis and she was seen by Dr. Amy Martinique who gave her doxepin and a different form of topical steroid. Fortunately, almost all her issue resolves sometime in December and she's had no problems until just the past few days when she developed a region on her posterior neck that was itchy. She has not treated this yet with a topical steroid. She continues on a combination of Claritin in the morning and doxepin in the evening and Singulair in the evening and as previously noted a topical steroid if needed. She also continues on Flonase for her upper airway disease which is under excellent control.  It should be noted that she discontinued her fish oil use and she did much better with her skin while off this supplement but restarted it about 3 weeks ago or so.  Her ears are not causing her problem and she no longer has any crispy like sounds. She still continues to occasionally use positive pressure inflation of her years.  She did obtain the flu vaccine this season.  Allergies as of 01/11/2017      Reactions   Latex Rash      Medication List      calcium carbonate 600 MG Tabs tablet Commonly known as:  OS-CAL Take 600 mg by mouth 2 (two) times daily with a meal.   cetirizine 10 MG tablet Commonly known as:  ZYRTEC Take 1 tablet (10 mg total) by mouth 2 (two) times daily.   cholecalciferol 1000 units tablet Commonly known as:  VITAMIN D Take 1,000  Units by mouth daily.   doxepin 10 MG capsule Commonly known as:  SINEQUAN Take 10 mg by mouth.   FISH OIL + D3 PO Take by mouth daily.   fluticasone 50 MCG/ACT nasal spray Commonly known as:  FLONASE Place 2 sprays into both nostrils daily.   loratadine 10 MG tablet Commonly known as:  CLARITIN Take 10 mg by mouth daily.   Magnesium 250 MG Tabs Take 1 tablet by mouth daily.   minoxidil 2 % external solution Commonly known as:  ROGAINE Apply topically daily.   montelukast 10 MG tablet Commonly known as:  SINGULAIR Take 1 tablet (10 mg total) by mouth at bedtime.       Past Medical History:  Diagnosis Date  . Allergy   . Arthritis   . Hyperlipidemia   . Neck pain, chronic   . 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 Rosacea    metrogel in past    Past Surgical History:  Procedure Laterality Date  . bcc excision     x3  . CHOLECYSTECTOMY    . COLONOSCOPY WITH PROPOFOL N/A 07/20/2016   Procedure: COLONOSCOPY WITH PROPOFOL;  Surgeon: Garlan Fair, MD;  Location: [facility];  Service: Endoscopy;  Laterality: N/A;  . EYE SURGERY  2011   bilateral cataract with lens implant  . HYSTEROSCOPY    . mohr's surgery  face  . TONSILLECTOMY    . TUBAL LIGATION      Review of systems negative except as noted in HPI / PMHx or noted below:  Review of Systems  Constitutional: Negative.   HENT: Negative.   Eyes: Negative.   Respiratory: Negative.   Cardiovascular: Negative.   Gastrointestinal: Negative.   Genitourinary: Negative.   Musculoskeletal: Negative.   Skin: Negative.   Neurological: Negative.   Endo/Heme/Allergies: Negative.   Psychiatric/Behavioral: Negative.      Objective:   Vitals:   01/11/17 1127  BP: 122/78  Pulse: 68  Resp: 16          Physical Exam  Constitutional: She is well-developed, well-nourished, and in no distress.  HENT:  Head: Normocephalic.  Right Ear: External ear and ear canal  normal. Right ear middle ear effusion: dull light reflex.  Left Ear: External ear and ear canal normal. Left ear middle ear effusion: Dull light reflex.  Nose: Nose normal. No mucosal edema or rhinorrhea.  Mouth/Throat: Uvula is midline, oropharynx is clear and moist and mucous membranes are normal. No oropharyngeal exudate.  Eyes: Conjunctivae are normal.  Neck: Trachea normal. No tracheal tenderness present. No tracheal deviation present. No thyromegaly present.  Cardiovascular: Normal rate, regular rhythm, S1 normal, S2 normal and normal heart sounds.   No murmur heard. Pulmonary/Chest: Breath sounds normal. No stridor. No respiratory distress. She has no wheezes. She has no rales.  Musculoskeletal: She exhibits no edema.  Lymphadenopathy:       Head (right side): No tonsillar adenopathy present.       Head (left side): No tonsillar adenopathy present.    She has no cervical adenopathy.  Neurological: She is alert. Gait normal.  Skin: Rash (Posterior neck induration and erythema.) noted. She is not diaphoretic. No erythema. Nails show no clubbing.  Psychiatric: Mood and affect normal.    Diagnostics: None   Assessment and Plan:   1. Inflammatory dermatosis   2. Other allergic rhinitis   3. Dysfunction of both eustachian tubes     1. Can continue to use Inflation of ears with positive pressure if needed.    2. Continue to Utilize a combination of the following:   A. Claritin 10 mg in AM  B. Doxepin 10mg  in PM  C. montelukast 10 mg one tablet one time per day  D. Topical steroid if needed  3. Continue Flonase as previously prescribed  4. Stop fish oil use.   5. Return to clinic in 6 months or earlier if problem  Rhonda [name] appears to be doing relatively well on her current plan and I will see her back in this clinic in approximately 6 months or earlier if there is a problem. She will continue to use anti-inflammatory agents for her upper airway and continue on occasional  anti-inflammatory agents for her skin as directed by Dr. Amy Martinique. If she has a problem over the course the next 6 months she is welcome to see me in this clinic but otherwise I'll just regroup with her at that point in time. Given the fact that her skin condition improved while she was off fish oil and she does appear to have developed a small exacerbation of her skin condition while reusing his fish oil I recommended that she stop the supplement.  Rhonda Katz, MD [facility]

## 2017-01-25 ENCOUNTER — Ambulatory Visit (INDEPENDENT_AMBULATORY_CARE_PROVIDER_SITE_OTHER): Payer: Medicare Other | Admitting: Orthopaedic Surgery

## 2017-01-25 ENCOUNTER — Encounter (INDEPENDENT_AMBULATORY_CARE_PROVIDER_SITE_OTHER): Payer: Self-pay | Admitting: Orthopaedic Surgery

## 2017-01-25 VITALS — BP 120/86 | HR 69 | Resp 14 | Ht 63.0 in | Wt 135.0 lb

## 2017-01-25 DIAGNOSIS — M25561 Pain in right knee: Secondary | ICD-10-CM | POA: Diagnosis not present

## 2017-01-25 MED ORDER — BUPIVACAINE HCL 0.5 % IJ SOLN
3.0000 mL | INTRAMUSCULAR | Status: AC | PRN
  Administered 2017-01-25: 3 mL via INTRA_ARTICULAR

## 2017-01-25 MED ORDER — METHYLPREDNISOLONE ACETATE 40 MG/ML IJ SUSP
80.0000 mg | INTRAMUSCULAR | Status: AC | PRN
  Administered 2017-01-25: 80 mg

## 2017-01-25 MED ORDER — LIDOCAINE HCL 1 % IJ SOLN
5.0000 mL | INTRAMUSCULAR | Status: AC | PRN
  Administered 2017-01-25: 5 mL

## 2017-01-25 NOTE — Progress Notes (Signed)
Office Visit Note   Patient: Rhonda [name]           Date of Birth: Mar 30, 1946           MRN: [id number] Visit Date: 01/25/2017              Requested by: Marin Olp, MD [address] [place], [place] [address] PCP: Garret Reddish, MD   Assessment & Plan: Visit Diagnoses:  1. Acute pain of right knee     Plan:  #1: Corticosteroid injection was given today without difficulty. She was able to weight-bear on this with her crutches. #2: We'll see how she does with this injection. If she is still painful she will then give [place] a call back and we will schedule an MRI scan of the right knee. #3: Otherwise follow back up when necessary.  Follow-Up Instructions: Return if symptoms worsen or fail to improve.   Orders:  No orders of the defined types were placed in this encounter.  No orders of the defined types were placed in this encounter.     Procedures: Large Joint Inj Date/Time: 01/25/2017 12:09 PM Performed by: Biagio Borg D Authorized by: Biagio Borg D   Consent Given by:  Patient Timeout: prior to procedure the correct patient, procedure, and site was verified   Indications:  Pain and joint swelling Location:  Knee Site:  R knee Prep: patient was prepped and draped in usual sterile fashion   Needle Size:  25 G Needle Length:  1.5 inches Approach:  Anteromedial Ultrasound Guidance: No   Fluoroscopic Guidance: No   Arthrogram: No   Medications:  5 mL lidocaine 1 %; 80 mg methylPREDNISolone acetate 40 MG/ML; 3 mL bupivacaine 0.5 % Aspiration Attempted: No   Patient tolerance:  Patient tolerated the procedure well with no immediate complications     Clinical Data: No additional findings.   Subjective: Chief Complaint  Patient presents with  . Right Knee - Pain    Rhonda [name] is a very pleasant 71 year old white female who presents today with pain in her right knee. History is that last Thursday while she was a [employer], she was walking out of  a Art therapist when she had heard a noise and felt a pop in her right knee. At that time she became incapacitated with pain and was taken to the urgent care in [facility] where x-rays were obtained. They placed her into a knee immobilizer and on crutches and gave her oxycodone for pain. She did fly back through [place] this past weekend. The pain discomfort in the right knee and refuses to bear weight secondary to pain. Her pain is diffuse and more in the infrapatellar and joint lines the more medial than lateral. She still has feelings of giving way. She comes in today for evaluation.      Review of Systems  Constitutional: Negative.   HENT: Negative.   Respiratory: Negative.   Cardiovascular: Negative.   Gastrointestinal: Negative.   Genitourinary: Negative.   Skin: Negative.   Neurological: Negative.   Hematological: Negative.   Psychiatric/Behavioral: Negative.      Objective: Vital Signs: BP 120/86   Pulse 69   Resp 14   Ht 5\' 3"  (1.6 m)   Wt 135 lb (61.2 kg)   BMI 23.91 kg/m   Physical Exam  Constitutional: She is oriented to person, place, and time. She appears well-developed and well-nourished.  HENT:  Head: Normocephalic and atraumatic.  Eyes: EOM are  normal. Pupils are equal, round, and reactive to light.  Pulmonary/Chest: Effort normal.  Musculoskeletal:       Right knee: She exhibits effusion (race).  Neurological: She is alert and oriented to person, place, and time.  Skin: Skin is warm and dry.  Psychiatric: She has a normal mood and affect. Her behavior is normal. Judgment and thought content normal.    Right Knee Exam   Tenderness  The patient is experiencing tenderness in the medial joint line and patellar tendon.  Range of Motion  Extension: 0  Flexion: 100   Tests  McMurray:  Medial - positive  Lachman:  Anterior - negative     Drawer:       Anterior - negative     Varus: negative Valgus: negative  Other  Other tests: effusion  (race) present      Specialty Comments:  No specialty comments available.  Imaging: No results found.  [facility]-rays [facility] reveals some mild decrease in joint space on the medial compartment. Not see any osteophyte formation. They did do an oblique which revealed a little bit of irregularity along the lateral distal femur. Posteriorly there may be a loose body noted to. There is certainly some calcification in the distal quadriceps tendon at the attachment of the patella. I'll does spurring at the patella on the lateral.   PMFS History: Patient Active Problem List   Diagnosis Date Noted  . Osteopenia 11/15/2014  . Allergic rhinitis 11/15/2014  . History of basal cell cancer 11/15/2014  . Bilateral shoulder pain 11/15/2014  . Rosacea   . Osteoarthritis 11/18/2011  . Hematuria 09/25/2008  . Hyperlipidemia 09/05/2006   Past Medical History:  Diagnosis Date  . Allergy   . Arthritis   . Hyperlipidemia   . Neck pain, chronic   . 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 Rosacea    metrogel in past    Family History  Problem Relation Age of Onset  . Alzheimer's disease Mother     died of bowel rupture  . Cancer Father     unknown  . CAD Son     80- died of MI    Past Surgical History:  Procedure Laterality Date  . bcc excision     x3  . CHOLECYSTECTOMY    . COLONOSCOPY WITH PROPOFOL N/A 07/20/2016   Procedure: COLONOSCOPY WITH PROPOFOL;  Surgeon: Garlan Fair, MD;  Location: [facility];  Service: Endoscopy;  Laterality: N/A;  . EYE SURGERY  2011   bilateral cataract with lens implant  . HYSTEROSCOPY    . mohr's surgery     face  . TONSILLECTOMY    . TUBAL LIGATION     Social History   Occupational History  . Not on file.   Social History Main Topics  . Smoking status: Former Smoker    Packs/day: 0.75    Years: 10.00    Types: Cigarettes  . Smokeless tobacco: Never Used      Comment: quit at age 78  . Alcohol use 0.0 oz/week     Comment: once a month  . Drug use: No  . Sexual activity: Not on file

## 2017-02-02 ENCOUNTER — Telehealth (INDEPENDENT_AMBULATORY_CARE_PROVIDER_SITE_OTHER): Payer: Self-pay | Admitting: Orthopaedic Surgery

## 2017-02-02 NOTE — Telephone Encounter (Signed)
Patient states her knee still hurts after the cortisone injection last week so she is wanting to know the next step for her.

## 2017-02-03 ENCOUNTER — Other Ambulatory Visit (INDEPENDENT_AMBULATORY_CARE_PROVIDER_SITE_OTHER): Payer: Self-pay

## 2017-02-03 DIAGNOSIS — M25561 Pain in right knee: Secondary | ICD-10-CM

## 2017-02-03 NOTE — Telephone Encounter (Signed)
PW dictation said if no improvement, referral for MRI

## 2017-02-09 ENCOUNTER — Other Ambulatory Visit: Payer: Self-pay | Admitting: Family Medicine

## 2017-02-09 DIAGNOSIS — Z1231 Encounter for screening mammogram for malignant neoplasm of breast: Secondary | ICD-10-CM

## 2017-02-12 ENCOUNTER — Ambulatory Visit
Admission: RE | Admit: 2017-02-12 | Discharge: 2017-02-12 | Disposition: A | Discharge status: Home / Self Care | Payer: Medicare Other | Source: Ambulatory Visit | Attending: Orthopaedic Surgery | Admitting: Orthopaedic Surgery

## 2017-02-12 DIAGNOSIS — M25561 Pain in right knee: Secondary | ICD-10-CM

## 2017-02-16 ENCOUNTER — Telehealth (INDEPENDENT_AMBULATORY_CARE_PROVIDER_SITE_OTHER): Payer: Self-pay | Admitting: Orthopaedic Surgery

## 2017-02-16 NOTE — Telephone Encounter (Signed)
Called.

## 2017-02-16 NOTE — Telephone Encounter (Signed)
DO you want to call?

## 2017-02-16 NOTE — Telephone Encounter (Signed)
Patient states she had an MRI done Saturday and is wanting to talk to Dr. Durward Fortes about those results. I advised patient that we make an appointment for the results and she is requesting a phone call for the results instead. Per patient, she was told Dr. Durward Fortes would be calling her.

## 2017-02-24 DIAGNOSIS — M23331 Other meniscus derangements, other medial meniscus, right knee: Secondary | ICD-10-CM | POA: Diagnosis not present

## 2017-03-02 ENCOUNTER — Telehealth (INDEPENDENT_AMBULATORY_CARE_PROVIDER_SITE_OTHER): Payer: Self-pay | Admitting: Orthopedic Surgery

## 2017-03-02 ENCOUNTER — Ambulatory Visit (INDEPENDENT_AMBULATORY_CARE_PROVIDER_SITE_OTHER): Payer: Medicare Other | Admitting: Orthopedic Surgery

## 2017-03-02 ENCOUNTER — Encounter (INDEPENDENT_AMBULATORY_CARE_PROVIDER_SITE_OTHER): Payer: Self-pay | Admitting: Orthopedic Surgery

## 2017-03-02 VITALS — BP 114/68 | HR 74 | Resp 12 | Ht 62.5 in | Wt 135.0 lb

## 2017-03-02 DIAGNOSIS — Z9889 Other specified postprocedural states: Secondary | ICD-10-CM

## 2017-03-02 NOTE — Telephone Encounter (Signed)
Patient has to go to [facility] for PT , they told her that they need her records first. She wants to see Joellen Jersey. Please call her [phone] (home)

## 2017-03-02 NOTE — Progress Notes (Signed)
Office Visit Note   Patient: Rhonda [name]           Date of Birth: 07/22/1946           MRN: [id number] Visit Date: 03/02/2017              Requested by: Marin Olp, MD [address] [place], [place] [address] PCP: Garret Reddish, MD   Assessment & Plan: Visit Diagnoses:  1. Status post arthroscopy of right knee     Plan:  #1: Physical therapy for one visit home exercise program at [facility] #2: A return to water walking and aerobics in another week.  Follow-Up Instructions: Return in about 2 weeks (around 03/16/2017).   Orders:  No orders of the defined types were placed in this encounter.  No orders of the defined types were placed in this encounter.     Procedures: No procedures performed   Clinical Data: No additional findings.   Subjective: Chief Complaint  Patient presents with  . Right Knee - Routine Post Op    Patient is here to today for a post-op follow up from a right knee medial meniscectomy done last Thursday. Patient is doing very well. She states she does not have much pain at all. She is ambulating without any assistive devices. She is able to get up from a chair easily.    Review of Systems  Constitutional: Negative.   HENT: Negative.   Respiratory: Negative.   Cardiovascular: Negative.   Gastrointestinal: Negative.   Genitourinary: Negative.   Skin: Negative.   Neurological: Negative.   Hematological: Negative.   Psychiatric/Behavioral: Negative.      Objective: Vital Signs: BP 114/68   Pulse 74   Resp 12   Ht 5' 2.5" (1.588 m)   Wt 135 lb (61.2 kg)   BMI 24.30 kg/m   Physical Exam  Constitutional: She is oriented to person, place, and time. She appears well-developed and well-nourished.  HENT:  Head: Normocephalic and atraumatic.  Eyes: EOM are normal. Pupils are equal, round, and reactive to light.  Pulmonary/Chest: Effort normal.  Musculoskeletal:       Right knee: She exhibits  effusion (race).  Neurological: She is alert and oriented to person, place, and time.  Skin: Skin is warm and dry.  Psychiatric: She has a normal mood and affect. Her behavior is normal. Judgment and thought content normal.    Right Knee Exam   Other  Other tests: effusion (race) present      Her wounds are clean and dry without any signs of infection. She has no calf pain to palpation. Trace to 1+ knee effusion without warmth. Range of motion from near full extension to about 110 of flexion.  Specialty Comments:  No specialty comments available.  Imaging: No results found.   PMFS History: Patient Active Problem List   Diagnosis Date Noted  . Osteopenia 11/15/2014  . Allergic rhinitis 11/15/2014  . History of basal cell cancer 11/15/2014  . Bilateral shoulder pain 11/15/2014  . Rosacea   . Osteoarthritis 11/18/2011  . Hematuria 09/25/2008  . Hyperlipidemia 09/05/2006   Past Medical History:  Diagnosis Date  . Allergy   . Arthritis   . Hyperlipidemia   . Neck pain, chronic   . 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 Rosacea    metrogel in past    Family History  Problem Relation Age of Onset  . Alzheimer's disease Mother  died of bowel rupture  . Cancer Father     unknown  . CAD Son     25- died of MI    Past Surgical History:  Procedure Laterality Date  . bcc excision     x3  . CHOLECYSTECTOMY    . COLONOSCOPY WITH PROPOFOL N/A 07/20/2016   Procedure: COLONOSCOPY WITH PROPOFOL;  Surgeon: Garlan Fair, MD;  Location: [facility];  Service: Endoscopy;  Laterality: N/A;  . EYE SURGERY  2011   bilateral cataract with lens implant  . HYSTEROSCOPY    . mohr's surgery     face  . TONSILLECTOMY    . TUBAL LIGATION     Social History   Occupational History  . Not on file.   Social History Main Topics  . Smoking status: Former Smoker    Packs/day: 0.75    Years: 10.00    Types: Cigarettes  . Smokeless  tobacco: Never Used     Comment: quit at age 2  . Alcohol use 0.0 oz/week     Comment: once a month  . Drug use: No  . Sexual activity: Not on file

## 2017-03-03 ENCOUNTER — Telehealth (INDEPENDENT_AMBULATORY_CARE_PROVIDER_SITE_OTHER): Payer: Self-pay | Admitting: Orthopaedic Surgery

## 2017-03-03 NOTE — Telephone Encounter (Signed)
Faxed notes to [facility] [employer]

## 2017-03-03 NOTE — Telephone Encounter (Signed)
Patient has now decided to go to [facility] for physical therapy since the therapist she would like to see works there now. Patient states her records from surgery must be sent before she is able to make her appointments for physical therapy. She is requesting this be done asap please.

## 2017-03-03 NOTE — Telephone Encounter (Signed)
Done in am

## 2017-03-07 ENCOUNTER — Ambulatory Visit
Admission: RE | Admit: 2017-03-07 | Discharge: 2017-03-07 | Disposition: A | Discharge status: Home / Self Care | Payer: Medicare Other | Source: Ambulatory Visit | Attending: Family Medicine | Admitting: Family Medicine

## 2017-03-07 DIAGNOSIS — Z1231 Encounter for screening mammogram for malignant neoplasm of breast: Secondary | ICD-10-CM

## 2017-03-08 ENCOUNTER — Other Ambulatory Visit: Payer: Self-pay | Admitting: Family Medicine

## 2017-03-08 DIAGNOSIS — R928 Other abnormal and inconclusive findings on diagnostic imaging of breast: Secondary | ICD-10-CM

## 2017-03-14 ENCOUNTER — Ambulatory Visit
Admission: RE | Admit: 2017-03-14 | Discharge: 2017-03-14 | Disposition: A | Discharge status: Home / Self Care | Payer: Medicare Other | Source: Ambulatory Visit | Attending: Family Medicine | Admitting: Family Medicine

## 2017-03-14 DIAGNOSIS — R928 Other abnormal and inconclusive findings on diagnostic imaging of breast: Secondary | ICD-10-CM

## 2017-03-16 ENCOUNTER — Encounter (INDEPENDENT_AMBULATORY_CARE_PROVIDER_SITE_OTHER): Payer: Self-pay | Admitting: Orthopedic Surgery

## 2017-03-16 ENCOUNTER — Ambulatory Visit (INDEPENDENT_AMBULATORY_CARE_PROVIDER_SITE_OTHER): Payer: Medicare Other | Admitting: Orthopedic Surgery

## 2017-03-16 ENCOUNTER — Ambulatory Visit (INDEPENDENT_AMBULATORY_CARE_PROVIDER_SITE_OTHER): Payer: Medicare Other

## 2017-03-16 VITALS — BP 119/50 | HR 68 | Resp 14 | Ht 62.5 in | Wt 130.0 lb

## 2017-03-16 DIAGNOSIS — M25562 Pain in left knee: Secondary | ICD-10-CM | POA: Diagnosis not present

## 2017-03-16 DIAGNOSIS — M1712 Unilateral primary osteoarthritis, left knee: Secondary | ICD-10-CM

## 2017-03-16 DIAGNOSIS — Z9889 Other specified postprocedural states: Secondary | ICD-10-CM

## 2017-03-16 MED ORDER — DICLOFENAC SODIUM 1 % TD GEL
2.0000 g | Freq: Four times a day (QID) | TRANSDERMAL | 1 refills | Status: DC | PRN
Start: 2017-03-16 — End: 2018-06-21

## 2017-03-16 NOTE — Progress Notes (Signed)
Office Visit Note   Patient: Rhonda [name]           Date of Birth: [birth year]           MRN: [id number] Visit Date: 03/16/2017              Requested by: Marin Olp, MD [address] [place], [place] [address] PCP: Garret Reddish, MD   Assessment & Plan: Visit Diagnoses:  1. Left knee pain, unspecified chronicity   2. Unilateral primary osteoarthritis, left knee   3. S/P arthroscopic surgery of right knee     Plan:  #1: Prescription for Voltaren gel was given for both knees as indicated #2: We discussed corticosteroid injection to the left knee but she is not interested at this time.  Follow-Up Instructions: Return if symptoms worsen or fail to improve.   Orders:  Orders Placed This Encounter  Procedures  . XR Knee Complete 4 Views Left   Meds ordered this encounter  Medications  . diclofenac sodium (VOLTAREN) 1 % GEL    Sig: Apply 2-4 g topically 4 (four) times daily as needed.    Dispense:  5 Tube    Refill:  1    Order Specific Question:   Supervising Provider    Answer:   Garald Balding [[id number]]      Procedures: No procedures performed   Clinical Data: No additional findings.   Subjective: Chief Complaint  Patient presents with  . Right Knee - Follow-up  . Left Knee - Pain  . Left Hip - Pain  . Right Hip - Pain  . Follow-up    02/24/17 ARTHROSCOPIC right knee, PT doing good, Advil PRN helps  . Knee Pain    Left knee pain x 12/2016 since injury of torn cartilage in right knee, no swelling, difficulty sleeping  . Hip Pain    Bil hip pain x 12/2016 since injury    Randal is seen today for evaluation follow-up of her right knee arthroscopic debridement with partial medial meniscectomy now 3 weeks postop.  She also at this time has requested evaluation of her left knee. She has pain at the medial compartment. Denies much in way of swelling up. No recent history of injury or trauma.    Review of Systems  Constitutional: Negative.     HENT: Negative.   Respiratory: Negative.   Cardiovascular: Negative.   Gastrointestinal: Negative.   Genitourinary: Negative.   Skin: Negative.   Neurological: Negative.   Hematological: Negative.   Psychiatric/Behavioral: Negative.      Objective: Vital Signs: BP (!) 119/50 (BP Location: Left Arm, Patient Position: Sitting, Cuff Size: Normal)   Pulse 68   Resp 14   Ht 5' 2.5" (1.588 m)   Wt 130 lb (59 kg)   BMI 23.40 kg/m   Physical Exam  Constitutional: She is oriented to person, place, and time. She appears well-developed and well-nourished.  HENT:  Head: Normocephalic and atraumatic.  Eyes: EOM are normal. Pupils are equal, round, and reactive to light.  Pulmonary/Chest: Effort normal.  Musculoskeletal:       Right knee: She exhibits no effusion (race).       Left knee: She exhibits no effusion.  Neurological: She is alert and oriented to person, place, and time.  Skin: Skin is warm and dry.  Psychiatric: She has a normal mood and affect. Her behavior is normal. Judgment and thought content normal.    Right Knee Exam   Range of  Motion  Extension: 0  Flexion: 120   Other  Other tests: no effusion (race) present   Left Knee Exam   Tenderness  The patient is experiencing tenderness in the medial joint line.  Range of Motion  Extension: 0  Flexion: 120   Tests  McMurray:  Medial - negative  Lachman:  Anterior - negative     Drawer:       Anterior - negative      Varus: negative Valgus: negative  Other  Effusion: no effusion present  Comments:  She does have some crepitance with range of motion.      Specialty Comments:  No specialty comments available.  Imaging: No results found.   PMFS History: Patient Active Problem List   Diagnosis Date Noted  . Osteopenia 11/15/2014  . Allergic rhinitis 11/15/2014  . History of basal cell cancer 11/15/2014  . Bilateral shoulder pain 11/15/2014  . Rosacea   . Osteoarthritis 11/18/2011  .  Hematuria 09/25/2008  . Hyperlipidemia 09/05/2006   Past Medical History:  Diagnosis Date  . Allergy   . Arthritis   . Hyperlipidemia   . Neck pain, chronic   . 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 Rosacea    metrogel in past    Family History  Problem Relation Age of Onset  . Alzheimer's disease Mother     died of bowel rupture  . Cancer Father     unknown  . CAD Son     60- died of MI    Past Surgical History:  Procedure Laterality Date  . bcc excision     x3  . CHOLECYSTECTOMY    . COLONOSCOPY WITH PROPOFOL N/A 07/20/2016   Procedure: COLONOSCOPY WITH PROPOFOL;  Surgeon: Garlan Fair, MD;  Location: [facility];  Service: Endoscopy;  Laterality: N/A;  . EYE SURGERY  2011   bilateral cataract with lens implant  . HYSTEROSCOPY    . KNEE SURGERY    . mohr's surgery     face  . TONSILLECTOMY    . TUBAL LIGATION     Social History   Occupational History  . Not on file.   Social History Main Topics  . Smoking status: Former Smoker    Packs/day: 0.75    Years: 10.00    Types: Cigarettes    Quit date: 60  . Smokeless tobacco: Never Used     Comment: quit at age 83  . Alcohol use 4.2 oz/week    7 Glasses of wine per week  . Drug use: No  . Sexual activity: Not on file

## 2017-03-22 LAB — HM PAP SMEAR: HM PAP: NORMAL

## 2017-05-23 ENCOUNTER — Encounter: Payer: Medicare Other | Admitting: Family Medicine

## 2017-06-08 ENCOUNTER — Ambulatory Visit: Payer: Medicare Other | Admitting: Allergy and Immunology

## 2017-06-16 ENCOUNTER — Encounter: Payer: Self-pay | Admitting: Family Medicine

## 2017-06-16 ENCOUNTER — Ambulatory Visit (INDEPENDENT_AMBULATORY_CARE_PROVIDER_SITE_OTHER): Payer: Medicare Other | Admitting: Family Medicine

## 2017-06-16 VITALS — BP 110/80 | HR 74 | Temp 97.8°F | Ht 62.5 in | Wt 130.4 lb

## 2017-06-16 DIAGNOSIS — L501 Idiopathic urticaria: Secondary | ICD-10-CM | POA: Diagnosis not present

## 2017-06-16 DIAGNOSIS — M159 Polyosteoarthritis, unspecified: Secondary | ICD-10-CM

## 2017-06-16 DIAGNOSIS — R5383 Other fatigue: Secondary | ICD-10-CM | POA: Diagnosis not present

## 2017-06-16 DIAGNOSIS — Z0000 Encounter for general adult medical examination without abnormal findings: Secondary | ICD-10-CM

## 2017-06-16 DIAGNOSIS — M150 Primary generalized (osteo)arthritis: Secondary | ICD-10-CM

## 2017-06-16 DIAGNOSIS — R319 Hematuria, unspecified: Secondary | ICD-10-CM

## 2017-06-16 DIAGNOSIS — E785 Hyperlipidemia, unspecified: Secondary | ICD-10-CM

## 2017-06-16 DIAGNOSIS — M810 Age-related osteoporosis without current pathological fracture: Secondary | ICD-10-CM | POA: Diagnosis not present

## 2017-06-16 LAB — COMPREHENSIVE METABOLIC PANEL
ALBUMIN: 4.4 g/dL (ref 3.5–5.2)
ALK PHOS: 85 U/L (ref 39–117)
ALT: 15 U/L (ref 0–35)
AST: 16 U/L (ref 0–37)
BILIRUBIN TOTAL: 0.5 mg/dL (ref 0.2–1.2)
BUN: 25 mg/dL — ABNORMAL HIGH (ref 6–23)
CALCIUM: 9.7 mg/dL (ref 8.4–10.5)
CO2: 30 mEq/L (ref 19–32)
Chloride: 104 mEq/L (ref 96–112)
Creatinine, Ser: 0.83 mg/dL (ref 0.40–1.20)
GFR: 71.6 mL/min (ref >60.00)
Glucose, Bld: 97 mg/dL (ref 70–99)
Potassium: 4.4 mEq/L (ref 3.5–5.1)
Sodium: 140 mEq/L (ref 135–145)
Total Protein: 7.1 g/dL (ref 6.0–8.3)

## 2017-06-16 LAB — LIPID PANEL
CHOLESTEROL: 197 mg/dL (ref 0–200)
HDL: 62.6 mg/dL (ref >39.00)
LDL Cholesterol: 119 mg/dL — ABNORMAL HIGH (ref 0–99)
NonHDL: 134.8
TRIGLYCERIDES: 78 mg/dL (ref 0.0–149.0)
Total CHOL/HDL Ratio: 3
VLDL: 15.6 mg/dL (ref 0.0–40.0)

## 2017-06-16 LAB — CBC
HCT: 43 % (ref 36.0–46.0)
HEMOGLOBIN: 14.4 g/dL (ref 12.0–15.0)
MCHC: 33.4 g/dL (ref 30.0–36.0)
MCV: 90.5 fl (ref 78.0–100.0)
PLATELETS: 251 10*3/uL (ref 150.0–400.0)
RBC: 4.75 Mil/uL (ref 3.87–5.11)
RDW: 14.4 % (ref 11.5–15.5)
WBC: 5.1 10*3/uL (ref 4.0–10.5)

## 2017-06-16 LAB — TSH: TSH: 2.54 u[IU]/mL (ref 0.35–4.50)

## 2017-06-16 NOTE — Assessment & Plan Note (Signed)
HLD- risk under 7.9% in 2017. Son had MI- she prefers to stay off statin as long as risk under 10%. Takes fish oil

## 2017-06-16 NOTE — Progress Notes (Addendum)
Phone: [phone]  Subjective:  Patient presents today for their annual physical. Chief complaint-noted.   See problem oriented charting- ROS- full  review of systems was completed and negative except for: prior rash which has now resolved and has some fatigue  The following were reviewed and entered/updated in epic: Past Medical History:  Diagnosis Date  . Allergy   . Arthritis   . Hyperlipidemia   . Neck pain, chronic   . 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 Rosacea    metrogel in past   Patient Active Problem List   Diagnosis Date Noted  . Idiopathic urticaria 06/16/2017    Priority: Medium  . Hyperlipidemia 09/05/2006    Priority: Medium  . Osteoporosis 11/15/2014    Priority: Low  . Allergic rhinitis 11/15/2014    Priority: Low  . History of basal cell cancer 11/15/2014    Priority: Low  . Rosacea     Priority: Low  . Osteoarthritis 11/18/2011    Priority: Low  . Hematuria 09/25/2008    Priority: Low  . Bilateral shoulder pain 11/15/2014   Past Surgical History:  Procedure Laterality Date  . bcc excision     x3  . CHOLECYSTECTOMY    . COLONOSCOPY WITH PROPOFOL N/A 07/20/2016   Procedure: COLONOSCOPY WITH PROPOFOL;  Surgeon: Rhonda Fair, MD;  Location: [facility];  Service: Endoscopy;  Laterality: N/A;  . EYE SURGERY  2011   bilateral cataract with lens implant  . HYSTEROSCOPY    . KNEE SURGERY  2018   arthroscopic for meniscal tear  . mohr's surgery     face  . TONSILLECTOMY    . TUBAL LIGATION      Family History  Problem Relation Age of Onset  . Alzheimer's disease Mother        died of bowel rupture  . Cancer Father        unknown  . CAD Son        61- died of MI    Medications- reviewed and updated Current Outpatient Prescriptions  Medication Sig Dispense Refill  . calcium carbonate (OS-CAL) 600 MG TABS Take 600 mg by mouth 2 (two) times daily with a meal.      . cholecalciferol (VITAMIN D)  1000 units tablet Take 1,000 Units by mouth daily.    . diclofenac sodium (VOLTAREN) 1 % GEL Apply 2-4 g topically 4 (four) times daily as needed. 5 Tube 1  . doxepin (SINEQUAN) 10 MG capsule Take 10 mg by mouth.    . fluticasone (FLONASE) 50 MCG/ACT nasal spray Place 2 sprays into both nostrils daily. 16 g 6  . Glucosamine-Chondroit-Vit C-Mn (GLUCOSAMINE CHONDR 1500 COMPLX) CAPS Take 1 capsule by mouth daily.    Marland Kitchen loratadine (CLARITIN) 10 MG tablet Take 10 mg by mouth daily.    . Magnesium 250 MG TABS Take 1 tablet by mouth daily.      . minoxidil (ROGAINE) 2 % external solution Apply topically daily.    . montelukast (SINGULAIR) 10 MG tablet Take one tablet once daily as directed 90 tablet 1   No current facility-administered medications for this visit.     Allergies-reviewed and updated Allergies  Allergen Reactions  . Latex Rash    Social History   Social History  . Marital status: Married    Spouse name: N/A  . Number of children: N/A  . Years of education: N/A   Social History Main Topics  . Smoking  status: Former Smoker    Packs/day: 0.75    Years: 10.00    Types: Cigarettes    Quit date: 58  . Smokeless tobacco: Never Used     Comment: quit at age 4  . Alcohol use 4.2 oz/week    7 Glasses of wine per week  . Drug use: No  . Sexual activity: Not Asked   Other Topics Concern  . None   Social History Narrative   Married 1972. 1 living child daughter Rhonda [name]. lost son 37 years old to [place] in 2017. No grandkids. 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     Objective: BP 110/80 (BP Location: Left Arm, Patient Position: Sitting, Cuff Size: Normal)   Pulse 74   Temp 97.8 F (36.6 C) (Oral)   Ht 5' 2.5" (1.588 m)   Wt 130 lb 6.4 oz (59.1 kg)   SpO2 97%   BMI 23.47 kg/m  Gen: NAD, resting comfortably HEENT: Mucous membranes are moist. Oropharynx  normal Neck: no thyromegaly CV: RRR no murmurs rubs or gallops Lungs: CTAB no crackles, wheeze, rhonchi Abdomen: soft/nontender/nondistended/normal bowel sounds. No rebound or guarding.  Ext: no edema Skin: warm, dry Neuro: grossly normal, moves all extremities, PERRLA  Assessment/Plan:  71 y.o. female presenting for annual physical.  Health Maintenance counseling: 1. Anticipatory guidance: Patient counseled regarding regular dental exams q6 months, eye exams yearly, wearing seatbelts.  2. Risk factor reduction:  Advised patient of need for regular exercise and diet rich and fruits and vegetables to reduce risk of heart attack and stroke. Exercise- getting back on ellpitical. Doing some water walking. Diet-balanced diete. Weight stable from last year.  Wt Readings from Last 3 Encounters:  06/16/17 130 lb 6.4 oz (59.1 kg)  03/16/17 130 lb (59 kg)  03/02/17 135 lb (61.2 kg)  3. Immunizations/screenings/ancillary studies- discussed shingrix at pharmacy. zostavax apparently at age 51 Immunization History  Administered Date(s) Administered  . Influenza Split 10/04/2011, 09/12/2012  . Influenza Whole 10/22/2009, 09/03/2010  . Influenza, High Dose Seasonal PF 09/05/2013, 09/12/2015, 09/06/2016  . Influenza,inj,Quad PF,36+ Mos 09/06/2014  . Pneumococcal Conjugate-13 09/06/2014  . Pneumococcal Polysaccharide-23 10/22/2009  . Td 09/25/2008  4. Cervical cancer screening- passed age based screening but still sees GYN 5. Breast cancer screening-  breast exam with GYN and mammogram 03/14/17 6. Colon cancer screening - 07/20/16 with 5 year follow up due to adenoma  7. Skin cancer screening- history of basal cell- yearly visits with dermatology  Status of chronic or acute concerns   Has been dealing with [name] amount of fatigue for at least 5 months. Could be BID antihistamine but will update labs.   Hematuria- didn't see urology last year- will get urine microscopic  Idiopathic  urticaria Idiopathic urticaria- on complex regimen through allergist. Dermatologist did a topical as well as doxepin 10mg  and that helped. Also thinks helping with her anxiety.   Osteoarthritis OA- neck, back, knees- has seen Dr. Ernestina [name] with [facility] in past. Started on voltaren gel for knees in April. Had arthroscopic surgery too within last year for torn meniscus  Hyperlipidemia HLD- risk under 7.9% in 2017. Son had MI- she prefers to stay off statin as long as risk under 10%. Takes fish oil   Osteoporosis Osteoporosis- placed on fosamax based on FRAX score on dexa last year. Came off worried that it was causing the rash-  but rash did not resolve- we will retrial medication at this point. Continue calcium and vitamin D. Gave handout on calcium- will try 600 through diet and 600 through pill   Return in about 1 year (around 06/16/2018) for physical.  Orders Placed This Encounter  Procedures  . CBC    [place]  . Comprehensive metabolic panel    [place]    Order Specific Question:   Has the patient fasted?    Answer:   No  . Lipid panel    [place]    Order Specific Question:   Has the patient fasted?    Answer:   No  . TSH    [place]  . Urine Microscopic   Meds ordered this encounter  Medications  . Glucosamine-Chondroit-Vit C-Mn (GLUCOSAMINE CHONDR 1500 COMPLX) CAPS    Sig: Take 1 capsule by mouth daily.   Return precautions advised.  Garret Reddish, MD

## 2017-06-16 NOTE — Assessment & Plan Note (Signed)
Osteoporosis- placed on fosamax based on FRAX score on dexa last year. Came off worried that it was causing the rash- but rash did not resolve- we will retrial medication at this point. Continue calcium and vitamin D. Gave handout on calcium- will try 600 through diet and 600 through pill

## 2017-06-16 NOTE — Assessment & Plan Note (Signed)
Idiopathic urticaria- on complex regimen through allergist. Dermatologist did a topical as well as doxepin 10mg  and that helped. Also thinks helping with her anxiety.

## 2017-06-16 NOTE — Patient Instructions (Addendum)
Would advise getting shingrix at your pharmacy   Sign release of information at the check out desk so we can send your bone density from last year to your gynecologist  Please restart fosamax. If you develop rash please let us know and we will list as allergy.   Please stop by lab before you go

## 2017-06-16 NOTE — Assessment & Plan Note (Signed)
OA- neck, back, knees- has seen Dr. Ernestina Patches with [facility] in past. Started on voltaren gel for knees in April. Had arthroscopic surgery too within last year for torn meniscus

## 2017-06-17 LAB — URINALYSIS, MICROSCOPIC ONLY

## 2017-06-20 ENCOUNTER — Other Ambulatory Visit: Payer: Self-pay

## 2017-06-20 ENCOUNTER — Other Ambulatory Visit (INDEPENDENT_AMBULATORY_CARE_PROVIDER_SITE_OTHER): Payer: Medicare Other

## 2017-06-20 DIAGNOSIS — R319 Hematuria, unspecified: Secondary | ICD-10-CM

## 2017-06-20 LAB — URINALYSIS, MICROSCOPIC ONLY: WBC, UA: NONE SEEN (ref 0–?)

## 2017-07-08 ENCOUNTER — Other Ambulatory Visit: Payer: Self-pay

## 2017-07-08 MED ORDER — ALENDRONATE SODIUM 70 MG PO TABS: 70.0000 mg | ORAL_TABLET | ORAL | 11 refills | Status: DC

## 2017-07-12 ENCOUNTER — Encounter: Payer: Self-pay | Admitting: Allergy and Immunology

## 2017-07-12 ENCOUNTER — Ambulatory Visit (INDEPENDENT_AMBULATORY_CARE_PROVIDER_SITE_OTHER): Payer: Medicare Other | Admitting: Allergy and Immunology

## 2017-07-12 VITALS — BP 118/64 | HR 72 | Resp 12

## 2017-07-12 DIAGNOSIS — L308 Other specified dermatitis: Secondary | ICD-10-CM

## 2017-07-12 DIAGNOSIS — J3089 Other allergic rhinitis: Secondary | ICD-10-CM

## 2017-07-12 DIAGNOSIS — L989 Disorder of the skin and subcutaneous tissue, unspecified: Secondary | ICD-10-CM

## 2017-07-12 NOTE — Patient Instructions (Signed)
  1. Taper Claritin 10mg  to one time in AM   2. Continue Doxepin 10mg  in PM   3. Continue montelukast 10 mg one tablet one time per day   4. Contact clinic after Labor day to discuss Doxepin tapering  5. Continue Flonase 1-2 sprays each nostril 1-7 times per week  6. Obtain fall flu vaccine  7. Return to clinic in 6 months or earlier if problem

## 2017-07-12 NOTE — Progress Notes (Signed)
Follow-up Note  Referring Provider: Marin Olp, MD Primary Provider: Marin Olp, MD Date of Office Visit: 07/12/2017  Subjective:   Rhonda [name] (DOB: 15-Nov-1946) is a 71 y.o. female who returns to the [facility] on 07/12/2017 in re-evaluation of the following:  HPI: Rhonda [name] returns to this clinic in reevaluation of her allergic rhinitis and inflammatory dermatosis. Her last visit to this clinic was February 2018. At that point in time we had her stop her fish oil consumption assuming that this was responsible for her inflammatory dermatosis.  Now that she has been off fish oil over the course of the past several months she has no issues with her skin. She has not had any rashes and she is not itchy. She continues to use doxepin in the evening and Claritin twice a day as well as montelukast daily.  She is sleepy in the morning. It takes her a while to get going after waking up every morning.  Her nose is doing quite well while intermittently using Flonase and she has not been having any issues with her ears.  Allergies as of 07/12/2017      Reactions   Latex Rash      Medication List      alendronate 70 MG tablet Commonly known as:  FOSAMAX Take 1 tablet (70 mg total) by mouth every 7 (seven) days. Take with a full glass of water on an empty stomach.   calcium carbonate 600 MG Tabs tablet Commonly known as:  OS-CAL Take 600 mg by mouth 2 (two) times daily with a meal.   cholecalciferol 1000 units tablet Commonly known as:  VITAMIN D Take 1,000 Units by mouth daily.   diclofenac sodium 1 % Gel Commonly known as:  VOLTAREN Apply 2-4 g topically 4 (four) times daily as needed.   doxepin 10 MG capsule Commonly known as:  SINEQUAN Take 10 mg by mouth.   fluticasone 50 MCG/ACT nasal spray Commonly known as:  FLONASE Place 2 sprays into both nostrils daily.   GLUCOSAMINE CHONDR 1500 COMPLX Caps Take 1 capsule by mouth daily.     loratadine 10 MG tablet Commonly known as:  CLARITIN Take 10 mg by mouth daily.   Magnesium 250 MG Tabs Take 1 tablet by mouth daily.   minoxidil 2 % external solution Commonly known as:  ROGAINE Apply topically daily.   montelukast 10 MG tablet Commonly known as:  SINGULAIR Take one tablet once daily as directed       Past Medical History:  Diagnosis Date  . Allergy   . Arthritis   . Hyperlipidemia   . Neck pain, chronic   . 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 Rosacea    metrogel in past    Past Surgical History:  Procedure Laterality Date  . bcc excision     x3  . CHOLECYSTECTOMY    . COLONOSCOPY WITH PROPOFOL N/A 07/20/2016   Procedure: COLONOSCOPY WITH PROPOFOL;  Surgeon: Garlan Fair, MD;  Location: [facility];  Service: Endoscopy;  Laterality: N/A;  . EYE SURGERY  2011   bilateral cataract with lens implant  . HYSTEROSCOPY    . KNEE SURGERY  2018   arthroscopic for meniscal tear  . mohr's surgery     face  . TONSILLECTOMY    . TUBAL LIGATION      Review of systems negative except as noted in HPI / PMHx or  noted below:  Review of Systems  Constitutional: Negative.   HENT: Negative.   Eyes: Negative.   Respiratory: Negative.   Cardiovascular: Negative.   Gastrointestinal: Negative.   Genitourinary: Negative.   Musculoskeletal: Negative.   Skin: Negative.   Neurological: Negative.   Endo/Heme/Allergies: Negative.   Psychiatric/Behavioral: Negative.      Objective:   Vitals:   07/12/17 1117  BP: 118/64  Pulse: 72  Resp: 12          Physical Exam  Constitutional: She is well-developed, well-nourished, and in no distress.  HENT:  Head: Normocephalic.  Right Ear: Tympanic membrane, external ear and ear canal normal.  Left Ear: Tympanic membrane, external ear and ear canal normal.  Nose: Nose normal. No mucosal edema or rhinorrhea.  Mouth/Throat: Uvula is midline, oropharynx is clear and moist  and mucous membranes are normal. No oropharyngeal exudate.  Bilateral hearing aids  Eyes: Conjunctivae are normal.  Neck: Trachea normal. No tracheal tenderness present. No tracheal deviation present. No thyromegaly present.  Cardiovascular: Normal rate, regular rhythm, S1 normal, S2 normal and normal heart sounds.   No murmur heard. Pulmonary/Chest: Breath sounds normal. No stridor. No respiratory distress. She has no wheezes. She has no rales.  Musculoskeletal: She exhibits no edema.  Lymphadenopathy:       Head (right side): No tonsillar adenopathy present.       Head (left side): No tonsillar adenopathy present.    She has no cervical adenopathy.  Neurological: She is alert. Gait normal.  Skin: No rash noted. She is not diaphoretic. No erythema. Nails show no clubbing.  Psychiatric: Mood and affect normal.    Diagnostics: none  Assessment and Plan:   1. Inflammatory dermatosis   2. Other allergic rhinitis     1. Taper Claritin 10mg  to one time in AM   2. Continue Doxepin 10mg  in PM   3. Continue montelukast 10 mg one tablet one time per day   4. Contact clinic after Labor day to discuss Doxepin tapering  5. Continue Flonase 1-2 sprays each nostril 1-7 times per week  6. Obtain fall flu vaccine  7. Return to clinic in 6 months or earlier if problem  Rhonda [name] is doing quite well but she appears to be having some side effects from utilizing her medications with the development of morning sedation. We will initially start tapering her medications by having her decrease her Claritin to one time per day and I have asked her to contact me by telephone in about 2 weeks and if she is still sleepy in the morning we will begin to taper her doxepin. I suspect that it was the use of her fish oil that resulted in her immunological hyperreactivity and she may not really need much medications directed against an inflammatory dermatosis at this point. I will see her back in this clinic in 6  months or earlier if there is a problem.  [name], MD [facility] [place]

## 2017-08-03 ENCOUNTER — Other Ambulatory Visit: Payer: Self-pay | Admitting: Allergy and Immunology

## 2017-08-03 ENCOUNTER — Telehealth: Payer: Self-pay | Admitting: Allergy and Immunology

## 2017-08-03 NOTE — Telephone Encounter (Signed)
Informed patient to hold doxepin and call us with an update if things get worse.

## 2017-08-03 NOTE — Telephone Encounter (Signed)
Pt called and wants to know if you want her to content to take Doxepin 10mg . They come in capsule form. So she can not cut them in half. [facility] [phone].

## 2017-08-03 NOTE — Telephone Encounter (Signed)
Please have patient discontinue her doxepin and we will see what happens with this maneuver.

## 2017-09-14 ENCOUNTER — Ambulatory Visit (INDEPENDENT_AMBULATORY_CARE_PROVIDER_SITE_OTHER): Payer: Medicare Other

## 2017-09-14 DIAGNOSIS — Z23 Encounter for immunization: Secondary | ICD-10-CM | POA: Diagnosis not present

## 2017-10-10 ENCOUNTER — Ambulatory Visit: Payer: Medicare Other | Admitting: Family Medicine

## 2017-10-10 ENCOUNTER — Encounter: Payer: Self-pay | Admitting: Family Medicine

## 2017-10-10 VITALS — BP 122/88 | HR 74 | Temp 97.5°F | Ht 62.5 in | Wt 130.4 lb

## 2017-10-10 DIAGNOSIS — B9789 Other viral agents as the cause of diseases classified elsewhere: Secondary | ICD-10-CM

## 2017-10-10 DIAGNOSIS — J019 Acute sinusitis, unspecified: Secondary | ICD-10-CM

## 2017-10-10 NOTE — Patient Instructions (Signed)
Sinsusitis/ sinus infection Viral based on <10 days, no double sickening, lack of severity of symptoms in first 3 days. Educated on signs that bacterial infection may have developed (symptoms over 10 days, double sickening).   Treatment: -considered steroid: we opted out -other symptomatic care with mucinex- Dm to loosen congestion and suppress cough some -Antibiotic indicated: not at present- would be willing to call one in if not improving by Friday (say 50% better) or if symptoms worsens - rest, staying very well hydrated  Finally, we reviewed reasons to return to care including if symptoms worsen or persist or new concerns arise (particularly fever or shortness of breath)

## 2017-10-10 NOTE — Progress Notes (Signed)
PCP: Marin Olp, MD  Subjective:  Rhonda [name] is a 71 y.o. year old very pleasant female patient who presents with sinusitis symptoms including nasal congestion, sinus tenderness, mild sore throat at first -other symptoms include: thinks she heard wheeze one day, fatigue -day of illness:5 -Symptoms show no change -previous treatments: tylenol for sinus pain -sick contacts/travel/risks: denies flu exposure.   ROS-denies fever, SOB, NVD, tooth pain  Pertinent Past Medical History-  Patient Active Problem List   Diagnosis Date Noted  . Idiopathic urticaria 06/16/2017    Priority: Medium  . Hyperlipidemia 09/05/2006    Priority: Medium  . Osteoporosis 11/15/2014    Priority: Low  . Allergic rhinitis 11/15/2014    Priority: Low  . History of basal cell cancer 11/15/2014    Priority: Low  . Rosacea     Priority: Low  . Osteoarthritis 11/18/2011    Priority: Low  . Hematuria 09/25/2008    Priority: Low  . Bilateral shoulder pain 11/15/2014    Medications- reviewed  Current Outpatient Medications  Medication Sig Dispense Refill  . alendronate (FOSAMAX) 70 MG tablet Take 1 tablet (70 mg total) by mouth every 7 (seven) days. Take with a full glass of water on an empty stomach. 4 tablet 11  . calcium carbonate (OS-CAL) 600 MG TABS Take 600 mg by mouth 2 (two) times daily with a meal.      . cholecalciferol (VITAMIN D) 1000 units tablet Take 1,000 Units by mouth daily.    . diclofenac sodium (VOLTAREN) 1 % GEL Apply 2-4 g topically 4 (four) times daily as needed. 5 Tube 1  . doxepin (SINEQUAN) 10 MG capsule Take 10 mg by mouth.    . fluticasone (FLONASE) 50 MCG/ACT nasal spray Place 2 sprays into both nostrils daily. 16 g 6  . Glucosamine-Chondroit-Vit C-Mn (GLUCOSAMINE CHONDR 1500 COMPLX) CAPS Take 1 capsule by mouth daily.    Marland Kitchen loratadine (CLARITIN) 10 MG tablet Take 10 mg by mouth daily.    . Magnesium 250 MG TABS Take 1 tablet by mouth daily.      . minoxidil  (ROGAINE) 2 % external solution Apply topically daily.    . montelukast (SINGULAIR) 10 MG tablet TAKE ONE TABLET ONCE DAILY AS DIRECTED 90 tablet 3   Objective: BP 122/88 (BP Location: Left Arm, Patient Position: Sitting, Cuff Size: Large)   Pulse 74   Temp (!) 97.5 F (36.4 C) (Oral)   Ht 5' 2.5" (1.588 m)   Wt 130 lb 6.4 oz (59.1 kg)   SpO2 99%   BMI 23.47 kg/m  Gen: NAD, resting comfortably HEENT: Turbinates largely normal, TM normal (after removing hearing aids), pharynx mildly erythematous with no tonsilar exudate or edema, frontal bilateral sinus tenderness as well as left maxillary CV: RRR no murmurs rubs or gallops Lungs: CTAB no crackles, wheeze, rhonchi Ext: no edema Skin: warm, dry, no rash  Assessment/Plan:  Sinsusitis/ sinus infection Viral based on <10 days, no double sickening, lack of severity of symptoms in first 3 days. Educated on signs that bacterial infection may have developed (symptoms over 10 days, double sickening).   Treatment: -considered steroid: we opted out -other symptomatic care with mucinex- Dm to loosen congestion and suppress cough some -Antibiotic indicated: not at present- would be willing to call one in if not improving by Friday (say 50% better) or if symptoms worsens - continue claritin and flonase for allergies  Finally, we reviewed reasons to return to care including if symptoms worsen  or persist or new concerns arise (particularly fever or shortness of breath)  Garret Reddish, MD

## 2017-10-14 ENCOUNTER — Telehealth: Payer: Self-pay | Admitting: Family Medicine

## 2017-10-14 MED ORDER — AMOXICILLIN-POT CLAVULANATE 875-125 MG PO TABS: 1.0000 | ORAL_TABLET | Freq: Two times a day (BID) | ORAL | 0 refills | Status: AC

## 2017-10-14 NOTE — Addendum Note (Signed)
Addended by: Marin Olp on: 10/14/2017 12:35 PM   Modules accepted: Orders

## 2017-10-14 NOTE — Telephone Encounter (Signed)
Patient calling b/c she is still not feeling better. Said Dr. Yong Channel told her to call for a script if she did not start feeling better

## 2017-10-14 NOTE — Telephone Encounter (Signed)
Patient with sinusitis and no improvement of symptoms.  Please advise.

## 2017-10-14 NOTE — Telephone Encounter (Signed)
Patient has been notified and verbalized understanding of all instructions.

## 2017-10-14 NOTE — Telephone Encounter (Signed)
I sent in augmentin for patient to take for 7 days. Encourage her to take it with food. See [place] back if not improved or worsening by Tuesday or Wednesday of next week. Ithink she will improve within 48 hours though.

## 2017-10-26 ENCOUNTER — Other Ambulatory Visit: Payer: Self-pay | Admitting: *Deleted

## 2017-10-26 MED ORDER — MONTELUKAST SODIUM 10 MG PO TABS: ORAL_TABLET | ORAL | 0 refills | Status: DC

## 2017-10-27 ENCOUNTER — Other Ambulatory Visit: Payer: Self-pay | Admitting: *Deleted

## 2017-10-27 MED ORDER — MONTELUKAST SODIUM 10 MG PO TABS: ORAL_TABLET | ORAL | 0 refills | Status: DC

## 2017-11-24 ENCOUNTER — Telehealth: Payer: Self-pay

## 2017-11-24 NOTE — Telephone Encounter (Signed)
Call to schedule AWV  Scheduled for jan 3rd at [place]

## 2017-12-01 ENCOUNTER — Ambulatory Visit (INDEPENDENT_AMBULATORY_CARE_PROVIDER_SITE_OTHER): Payer: Medicare Other | Admitting: *Deleted

## 2017-12-01 ENCOUNTER — Encounter: Payer: Self-pay | Admitting: *Deleted

## 2017-12-01 VITALS — BP 108/70 | HR 72 | Ht 63.0 in | Wt 128.0 lb

## 2017-12-01 DIAGNOSIS — Z0000 Encounter for general adult medical examination without abnormal findings: Secondary | ICD-10-CM | POA: Diagnosis not present

## 2017-12-01 NOTE — Patient Instructions (Addendum)
Rhonda [name] , Thank you for taking time to come for your Medicare Wellness Visit. I appreciate your ongoing commitment to your health goals. Please review the following plan we discussed and let me know if I can assist you in the future.   Will see Dermatologist or Rhonda [name] regarding the area of concern on your right check  Will have left  eye checked if it continues to bother  you   Shingrix is a vaccine for the prevention of Shingles in Adults 50 and older.  If you are on Medicare, you can request a prescription from your doctor to be filled at a pharmacy.  Please check with your benefits regarding applicable copays or out of pocket expenses.  The Shingrix is given in 2 vaccines approx 8 weeks apart. You must receive the 2nd dose prior to 6 months from receipt of the first.  You can visit the CDC.gov for more information  A Tetanus is recommended every 10 years. Medicare covers a tetanus if you have a cut or wound; otherwise, there may be a charge. If you had not had a tetanus with pertusses, known as the Tdap, you can take this anytime. 'You will need by 08/2018"   Per some GYN- just for general information you can discuss with your GYN  After hysterectomy or menopause, there are still many aspects that still need evaluation. The likelihood of cancers increase with age and problems are often first found through annual exams. Whether you are postmenopausal or have had a hysterectomy, we check every year for palpable masses in the pelvis, and we assess vulvar and vaginal tissues (especially for women with a history of HPV). In post-menopausal women who have had nothing removed, we need to evaluate for postmenopausal bleeding, or evidence of masses on the uterus, cervix, and ovaries.  Pap smears may be discontinued for women who have had a hysterectomy with no significance of prior abnormal Pap smears, but should continue to have routine pelvic exams performed by their doctor.  In  addition to the evaluations described earlier, we will address other issues that become more prevalent with age. These might include managing menopausal symptoms, urinary incontinence, prolapse of pelvic tissues and organs, and more. We'll also talk about the continuing necessity of practicing safe sex. (Did you know that the older population is one of the fastest growing age groups for sexually transmitted infections?) seeing a gynecologist after menopause or a hysterectomy is not just about the reproductive system.   GYN doctors will also discuss preventive screenings and tests such as osteoporosis screening, mammography, colon cancer screening and your risk for diabetes, just to name a few. A Dexa scan, known as an osteoporosis test, will assess bone strength and a colonoscopy can search for colon masses. And, of course, all woman above the age of 70 should perform monthly breast exams and schedule screening mammograms. Vaccines may also be discussed during an annual gynecological visit as well as a discussion about your hormones, menopause management and other health issues.    These are the goals we discussed: Goals    . Exercise 150 min/wk Moderate Activity     Will continue progress with your exercise Be Happy with your Choices        This is a list of the screening recommended for you and due dates:  Health Maintenance  Topic Date Due  . Tetanus Vaccine  09/25/2018  . Mammogram  03/08/2019  . Colon Cancer Screening  07/20/2021  . Flu  Shot  Completed  . DEXA scan (bone density measurement)  Completed  .  Hepatitis C: One time screening is recommended by Center for Disease Control  (CDC) for  adults born from [birth year] through 1965.   Completed  . Pneumonia vaccines  Completed    Prevention of falls: Remove rugs or any tripping hazards in the home Use Non slip mats in bathtubs and showers Placing grab bars next to the toilet and or shower Placing handrails on both sides of the stair  way Adding extra lighting in the home.   Personal safety issues reviewed:  1. Consider starting a community watch program per [facility] 2.  Changes batteries is smoke detector and/or carbon monoxide detector  3.  If you have firearms; keep them in a safe place 4.  Wear protection when in the sun; Always wear sunscreen or a hat; It is good to have your doctor check your skin annually or review any new areas of concern 5. Driving safety; Keep in the right lane; stay 3 car lengths behind the car in front of you on the highway; look 3 times prior to pulling out; carry your cell phone everywhere you go!       Fall Prevention in the Home Falls can cause injuries. They can happen to people of all ages. There are many things you can do to make your home safe and to help prevent falls. What can I do on the outside of my home?  Regularly fix the edges of walkways and driveways and fix any cracks.  Remove anything that might make you trip as you walk through a door, such as a raised step or threshold.  Trim any bushes or trees on the path to your home.  Use bright outdoor lighting.  Clear any walking paths of anything that might make someone trip, such as rocks or tools.  Regularly check to see if handrails are loose or broken. Make sure that both sides of any steps have handrails.  Any raised decks and porches should have guardrails on the edges.  Have any leaves, snow, or ice cleared regularly.  Use sand or salt on walking paths during winter.  Clean up any spills in your garage right away. This includes oil or grease spills. What can I do in the bathroom?  Use night lights.  Install grab bars by the toilet and in the tub and shower. Do not use towel bars as grab bars.  Use non-skid mats or decals in the tub or shower.  If you need to sit down in the shower, use a plastic, non-slip stool.  Keep the floor dry. Clean up any water that spills on the floor as soon as it  happens.  Remove soap buildup in the tub or shower regularly.  Attach bath mats securely with double-sided non-slip rug tape.  Do not have throw rugs and other things on the floor that can make you trip. What can I do in the bedroom?  Use night lights.  Make sure that you have a light by your bed that is easy to reach.  Do not use any sheets or blankets that are too big for your bed. They should not hang down onto the floor.  Have a firm chair that has side arms. You can use this for support while you get dressed.  Do not have throw rugs and other things on the floor that can make you trip. What can I do in the kitchen?  Clean up any spills right away.  Avoid walking on wet floors.  Keep items that you use a lot in easy-to-reach places.  If you need to reach something above you, use a strong step stool that has a grab bar.  Keep electrical cords out of the way.  Do not use floor polish or wax that makes floors slippery. If you must use wax, use non-skid floor wax.  Do not have throw rugs and other things on the floor that can make you trip. What can I do with my stairs?  Do not leave any items on the stairs.  Make sure that there are handrails on both sides of the stairs and use them. Fix handrails that are broken or loose. Make sure that handrails are as long as the stairways.  Check any carpeting to make sure that it is firmly attached to the stairs. Fix any carpet that is loose or worn.  Avoid having throw rugs at the top or bottom of the stairs. If you do have throw rugs, attach them to the floor with carpet tape.  Make sure that you have a light switch at the top of the stairs and the bottom of the stairs. If you do not have them, ask someone to add them for you. What else can I do to help prevent falls?  Wear shoes that: ? Do not have high heels. ? Have rubber bottoms. ? Are comfortable and fit you well. ? Are closed at the toe. Do not wear sandals.  If you  use a stepladder: ? Make sure that it is fully opened. Do not climb a closed stepladder. ? Make sure that both sides of the stepladder are locked into place. ? Ask someone to hold it for you, if possible.  Clearly mark and make sure that you can see: ? Any grab bars or handrails. ? First and last steps. ? Where the edge of each step is.  Use tools that help you move around (mobility aids) if they are needed. These include: ? Canes. ? Walkers. ? Scooters. ? Crutches.  Turn on the lights when you go into a dark area. Replace any light bulbs as soon as they burn out.  Set up your furniture so you have a clear path. Avoid moving your furniture around.  If any of your floors are uneven, fix them.  If there are any pets around you, be aware of where they are.  Review your medicines with your doctor. Some medicines can make you feel dizzy. This can increase your chance of falling. Ask your doctor what other things that you can do to help prevent falls. This information is not intended to replace advice given to you by your health care provider. Make sure you discuss any questions you have with your health care provider. Document Released: 09/11/2009 Document Revised: 04/22/2016 Document Reviewed: 12/20/2014 Elsevier Interactive Patient Education  2018 [place] Maintenance, Female Adopting a healthy lifestyle and getting preventive care can go a long way to promote health and wellness. Talk with your health care provider about what schedule of regular examinations is right for you. This is a good chance for you to check in with your provider about disease prevention and staying healthy. In between checkups, there are plenty of things you can do on your own. Experts have done a lot of research about which lifestyle changes and preventive measures are most likely to keep you healthy. Ask your health care provider for more  information. Weight and diet Eat a healthy diet  Be  sure to include plenty of vegetables, fruits, low-fat dairy products, and lean protein.  Do not eat a lot of foods high in solid fats, added sugars, or salt.  Get regular exercise. This is one of the most important things you can do for your health. ? Most adults should exercise for at least 150 minutes each week. The exercise should increase your heart rate and make you sweat (moderate-intensity exercise). ? Most adults should also do strengthening exercises at least twice a week. This is in addition to the moderate-intensity exercise.  Maintain a healthy weight  Body mass index (BMI) is a measurement that can be used to identify possible weight problems. It estimates body fat based on height and weight. Your health care provider can help determine your BMI and help you achieve or maintain a healthy weight.  For females 27 years of age and older: ? A BMI below 18.5 is considered underweight. ? A BMI of 18.5 to 24.9 is normal. ? A BMI of 25 to 29.9 is considered overweight. ? A BMI of 30 and above is considered obese.  Watch levels of cholesterol and blood lipids  You should start having your blood tested for lipids and cholesterol at 74 years of age, then have this test every 5 years.  You may need to have your cholesterol levels checked more often if: ? Your lipid or cholesterol levels are high. ? You are older than 74 years of age. ? You are at high risk for heart disease.  Cancer screening Lung Cancer  Lung cancer screening is recommended for adults 16-56 years old who are at high risk for lung cancer because of a history of smoking.  A yearly low-dose CT scan of the lungs is recommended for people who: ? Currently smoke. ? Have quit within the past 15 years. ? Have at least a 30-pack-year history of smoking. A pack year is smoking an average of one pack of cigarettes a day for 1 year.  Yearly screening should continue until it has been 15 years since you quit.  Yearly  screening should stop if you develop a health problem that would prevent you from having lung cancer treatment.  Breast Cancer  Practice breast self-awareness. This means understanding how your breasts normally appear and feel.  It also means doing regular breast self-exams. Let your health care provider know about any changes, no matter how small.  If you are in your 20s or 30s, you should have a clinical breast exam (CBE) by a health care provider every 1-3 years as part of a regular health exam.  If you are 69 or older, have a CBE every year. Also consider having a breast X-ray (mammogram) every year.  If you have a family history of breast cancer, talk to your health care provider about genetic screening.  If you are at high risk for breast cancer, talk to your health care provider about having an MRI and a mammogram every year.  Breast cancer gene (BRCA) assessment is recommended for women who have family members with BRCA-related cancers. BRCA-related cancers include: ? Breast. ? Ovarian. ? Tubal. ? Peritoneal cancers.  Results of the assessment will determine the need for genetic counseling and BRCA1 and BRCA2 testing.  Cervical Cancer Your health care provider may recommend that you be screened regularly for cancer of the pelvic organs (ovaries, uterus, and vagina). This screening involves a pelvic examination, including checking for  microscopic changes to the surface of your cervix (Pap test). You may be encouraged to have this screening done every 3 years, beginning at age 79.  For women ages 74-65, health care providers may recommend pelvic exams and Pap testing every 3 years, or they may recommend the Pap and pelvic exam, combined with testing for human papilloma virus (HPV), every 5 years. Some types of HPV increase your risk of cervical cancer. Testing for HPV may also be done on women of any age with unclear Pap test results.  Other health care providers may not recommend  any screening for nonpregnant women who are considered low risk for pelvic cancer and who do not have symptoms. Ask your health care provider if a screening pelvic exam is right for you.  If you have had past treatment for cervical cancer or a condition that could lead to cancer, you need Pap tests and screening for cancer for at least 20 years after your treatment. If Pap tests have been discontinued, your risk factors (such as having a new sexual partner) need to be reassessed to determine if screening should resume. Some women have medical problems that increase the chance of getting cervical cancer. In these cases, your health care provider may recommend more frequent screening and Pap tests.  Colorectal Cancer  This type of cancer can be detected and often prevented.  Routine colorectal cancer screening usually begins at 74 years of age and continues through 74 years of age.  Your health care provider may recommend screening at an earlier age if you have risk factors for colon cancer.  Your health care provider may also recommend using home test kits to check for hidden blood in the stool.  A small camera at the end of a tube can be used to examine your colon directly (sigmoidoscopy or colonoscopy). This is done to check for the earliest forms of colorectal cancer.  Routine screening usually begins at age 57.  Direct examination of the colon should be repeated every 5-10 years through 74 years of age. However, you may need to be screened more often if early forms of precancerous polyps or small growths are found.  Skin Cancer  Check your skin from head to toe regularly.  Tell your health care provider about any new moles or changes in moles, especially if there is a change in a mole's shape or color.  Also tell your health care provider if you have a mole that is larger than the size of a pencil eraser.  Always use sunscreen. Apply sunscreen liberally and repeatedly throughout the  day.  Protect yourself by wearing long sleeves, pants, a wide-brimmed hat, and sunglasses whenever you are outside.  Heart disease, diabetes, and high blood pressure  High blood pressure causes heart disease and increases the risk of stroke. High blood pressure is more likely to develop in: ? People who have blood pressure in the high end of the normal range (130-139/85-89 mm Hg). ? People who are overweight or obese. ? People who are African American.  If you are 54-24 years of age, have your blood pressure checked every 3-5 years. If you are 51 years of age or older, have your blood pressure checked every year. You should have your blood pressure measured twice-once when you are at a hospital or clinic, and once when you are not at a hospital or clinic. Record the average of the two measurements. To check your blood pressure when you are not at a hospital  or clinic, you can use: ? An automated blood pressure machine at a pharmacy. ? A home blood pressure monitor.  If you are between 51 years and 65 years old, ask your health care provider if you should take aspirin to prevent strokes.  Have regular diabetes screenings. This involves taking a blood sample to check your fasting blood sugar level. ? If you are at a normal weight and have a low risk for diabetes, have this test once every three years after 74 years of age. ? If you are overweight and have a high risk for diabetes, consider being tested at a younger age or more often. Preventing infection Hepatitis B  If you have a higher risk for hepatitis B, you should be screened for this virus. You are considered at high risk for hepatitis B if: ? You were born in a country where hepatitis B is common. Ask your health care provider which countries are considered high risk. ? Your parents were born in a high-risk country, and you have not been immunized against hepatitis B (hepatitis B vaccine). ? You have HIV or AIDS. ? You use needles to  inject street drugs. ? You live with someone who has hepatitis B. ? You have had sex with someone who has hepatitis B. ? You get hemodialysis treatment. ? You take certain medicines for conditions, including cancer, organ transplantation, and autoimmune conditions.  Hepatitis C  Blood testing is recommended for: ? Everyone born from [birth year] through 1967. ? Anyone with known risk factors for hepatitis C.  Sexually transmitted infections (STIs)  You should be screened for sexually transmitted infections (STIs) including gonorrhea and chlamydia if: ? You are sexually active and are younger than 74 years of age. ? You are older than 74 years of age and your health care provider tells you that you are at risk for this type of infection. ? Your sexual activity has changed since you were last screened and you are at an increased risk for chlamydia or gonorrhea. Ask your health care provider if you are at risk.  If you do not have HIV, but are at risk, it may be recommended that you take a prescription medicine daily to prevent HIV infection. This is called pre-exposure prophylaxis (PrEP). You are considered at risk if: ? You are sexually active and do not regularly use condoms or know the HIV status of your partner(s). ? You take drugs by injection. ? You are sexually active with a partner who has HIV.  Talk with your health care provider about whether you are at high risk of being infected with HIV. If you choose to begin PrEP, you should first be tested for HIV. You should then be tested every 3 months for as long as you are taking PrEP. Pregnancy  If you are premenopausal and you may become pregnant, ask your health care provider about preconception counseling.  If you may become pregnant, take 400 to 800 micrograms (mcg) of folic acid every day.  If you want to prevent pregnancy, talk to your health care provider about birth control (contraception). Osteoporosis and  menopause  Osteoporosis is a disease in which the bones lose minerals and strength with aging. This can result in serious bone fractures. Your risk for osteoporosis can be identified using a bone density scan.  If you are 16 years of age or older, or if you are at risk for osteoporosis and fractures, ask your health care provider if you should be screened.  Ask your health care provider whether you should take a calcium or vitamin D supplement to lower your risk for osteoporosis.  Menopause may have certain physical symptoms and risks.  Hormone replacement therapy may reduce some of these symptoms and risks. Talk to your health care provider about whether hormone replacement therapy is right for you. Follow these instructions at home:  Schedule regular health, dental, and eye exams.  Stay current with your immunizations.  Do not use any tobacco products including cigarettes, chewing tobacco, or electronic cigarettes.  If you are pregnant, do not drink alcohol.  If you are breastfeeding, limit how much and how often you drink alcohol.  Limit alcohol intake to no more than 1 drink per day for nonpregnant women. One drink equals 12 ounces of beer, 5 ounces of wine, or 1 ounces of hard liquor.  Do not use street drugs.  Do not share needles.  Ask your health care provider for help if you need support or information about quitting drugs.  Tell your health care provider if you often feel depressed.  Tell your health care provider if you have ever been abused or do not feel safe at home. This information is not intended to replace advice given to you by your health care provider. Make sure you discuss any questions you have with your health care provider. Document Released: 05/31/2011 Document Revised: 04/22/2016 Document Reviewed: 08/19/2015 Elsevier Interactive Patient Education  [employer].

## 2017-12-01 NOTE — Progress Notes (Signed)
Subjective:   Rhonda [name] is a 72 y.o. female who presents for Medicare Annual (Subsequent) preventive examination.  Last OV for viral sinusitis in November Preventive health exam in July  CAD son expired in 2017   Psychosocial Married 22'  dtr living; son deceased to [place] at school Dtr lives in [facility] home for Christmas   Was in [place] this year when she missed a step and tore her menicus- left knee Was seen by Dr. Durward Fortes once she got home  Doing well now  Diet BMI 23  Eats 3 meals;  Breakfast cheerios and banana Lunch; yogurt or apple or pear Peanut butter sandwich Supper; spouse does a  Lot of the cooking Salad; meat and vegetable with the salad   Exercise;  HDL 62 [facility]  Started water walking instead of deep water aerobics Has just started this again due to knee injury and spouse's issues Walks some in the neighborhood x 2 miles Still uses elliptial  BMI 22  Cardiac Risk Factors include: advanced age (>44men, >35 women);family history of premature cardiovascular disease  There are no preventive care reminders to display for this patient.  Colonoscopy 06/2016 - repeat in 5 years  Bone density; 05/2016  -2.1 ( on Fosamax) Mammogram 02/2017 Pap 2014 Dr.Richardson is her GYN -  Discussed continuing to go and given information but is her personal choice  Smoking history/ former smoker with 7.5 pack years  Second Hand Smoke status; No Smokers in the home  ETOH rarely; glass of wine or gin and tonic at hs/ one drink only;educated on 2 oz limit  Home is 4 floors including a basement Has an elevator now in the home  No decision to scale down at this time       Objective:     Vitals: BP 108/70   Pulse 72   Ht 5\' 3"  (1.6 m)   Wt 128 lb (58.1 kg)   SpO2 99%   BMI 22.67 kg/m   Body mass index is 22.67 kg/m.  Advanced Directives 12/01/2017 12/03/2016 07/20/2016 07/13/2016  Does Patient Have a Medical  Advance Directive? Yes Yes Yes Yes  Type of Advance Directive - [place];Living will Rote;Living will Living will  Copy of [place] in Chart? - No - copy requested Yes No - copy requested    Tobacco Social History   Tobacco Use  Smoking Status Former Smoker  . Packs/day: 0.75  . Years: 10.00  . Pack years: 7.50  . Types: Cigarettes  . Last attempt to quit: 1972  . Years since quitting: 47.0  Smokeless Tobacco Never Used  Tobacco Comment   quit at age 55     Counseling given: Yes Comment: quit at age 46   Clinical Intake:    Past Medical History:  Diagnosis Date  . Allergy   . Arthritis   . Hyperlipidemia   . Neck pain, chronic   . 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 Rosacea    metrogel in past   Past Surgical History:  Procedure Laterality Date  . bcc excision     x3  . CHOLECYSTECTOMY    . COLONOSCOPY WITH PROPOFOL N/A 07/20/2016   Procedure: COLONOSCOPY WITH PROPOFOL;  Surgeon: Garlan Fair, MD;  Location: [facility];  Service: Endoscopy;  Laterality: N/A;  . EYE SURGERY  2011  bilateral cataract with lens implant  . HYSTEROSCOPY    . KNEE SURGERY  2018   arthroscopic for meniscal tear  . mohr's surgery     face  . TONSILLECTOMY    . TUBAL LIGATION     Family History  Problem Relation Age of Onset  . Alzheimer's disease Mother        died of bowel rupture  . Cancer Father        unknown  . CAD Son        48- died of MI   Social History   Socioeconomic History  . Marital status: Married    Spouse name: Not on file  . Number of children: Not on file  . Years of education: Not on file  . Highest education level: Not on file  Social Needs  . Financial resource strain: Not on file  . Food insecurity - worry: Not on file  . Food insecurity - inability: Not on file  . Transportation needs - medical: Not on file  . Transportation needs -  non-medical: Not on file  Occupational History  . Not on file  Tobacco Use  . Smoking status: Former Smoker    Packs/day: 0.75    Years: 10.00    Pack years: 7.50    Types: Cigarettes    Last attempt to quit: 1972    Years since quitting: 47.0  . Smokeless tobacco: Never Used  . Tobacco comment: quit at age 64  Substance and Sexual Activity  . Alcohol use: Yes    Alcohol/week: 4.2 oz    Types: 7 Glasses of wine per week    Comment: one glass of wine a day  . Drug use: No  . Sexual activity: Not on file  Other Topics Concern  . Not on file  Social History Narrative   Married 1972. 1 living child daughter [name]. lost son 54 years old to [place] in 2017. No grandkids. 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     Outpatient Encounter Medications as of 12/01/2017  Medication Sig  . alendronate (FOSAMAX) 70 MG tablet Take 1 tablet (70 mg total) by mouth every 7 (seven) days. Take with a full glass of water on an empty stomach.  . calcium carbonate (OS-CAL) 600 MG TABS Take 600 mg by mouth 2 (two) times daily with a meal.    . cholecalciferol (VITAMIN D) 1000 units tablet Take 1,000 Units by mouth daily.  . fluticasone (FLONASE) 50 MCG/ACT nasal spray Place 2 sprays into both nostrils daily.  . Glucosamine-Chondroit-Vit C-Mn (GLUCOSAMINE CHONDR 1500 COMPLX) CAPS Take 1 capsule by mouth daily.  Marland Kitchen loratadine (CLARITIN) 10 MG tablet Take 10 mg by mouth daily.  . Magnesium 250 MG TABS Take 1 tablet by mouth daily.    . minoxidil (ROGAINE) 2 % external solution Apply topically daily.  . montelukast (SINGULAIR) 10 MG tablet Take one tablet once daily as directed  . diclofenac sodium (VOLTAREN) 1 % GEL Apply 2-4 g topically 4 (four) times daily as needed. (Patient not taking: Reported on 12/01/2017)  . doxepin (SINEQUAN) 10 MG capsule Take 10 mg by mouth.   No facility-administered  encounter medications on file as of 12/01/2017.     Activities of Daily Living In your present state of health, do you have any difficulty performing the following activities: 12/01/2017 12/03/2016  Hearing?  N Y  Comment - good with hearing aids   Vision? N Y  Difficulty concentrating or making decisions? N N  Walking or climbing stairs? N N  Dressing or bathing? N N  Doing errands, shopping? N N  Preparing Food and eating ? N N  Using the Toilet? N N  In the past six months, have you accidently leaked urine? N N  Do you have problems with loss of bowel control? N N  Managing your Medications? N N  Managing your Finances? N N  Housekeeping or managing your Housekeeping? N N  Some recent data might be hidden    Patient Care Team: Marin Olp, MD as PCP - General (Family Medicine)    Assessment:   This is a routine wellness examination for Annabeth.  Exercise Activities and Dietary recommendations Current Exercise Habits: Structured exercise class, Time (Minutes): 45, Frequency (Times/Week): 4, Weekly Exercise (Minutes/Week): 180, Intensity: Moderate  Goals    . Exercise 150 min/wk Moderate Activity     Will continue progress with your exercise Be Happy with your Choices        Fall Risk Fall Risk  12/01/2017 12/03/2016 05/19/2016 05/13/2015 04/15/2014  Falls in the past year? Yes No No No No  Number falls in past yr: 1 - - - -  Risk for fall due to : Impaired balance/gait - - - History of fall(s)  Follow up Education provided - - - -     Depression Screen PHQ 2/9 Scores 12/01/2017 12/03/2016 05/19/2016 05/13/2015  PHQ - 2 Score 0 0 0 0     Cognitive Function MMSE - Mini Mental State Exam 12/01/2017 12/03/2016  Not completed: (No Data) (No Data)     Ad8 score reviewed for issues:  Issues making decisions:  Less interest in hobbies / activities:  Repeats questions, stories (family complaining):  Trouble using ordinary gadgets (microwave, computer, phone):  Forgets the  month or year:   Mismanaging finances:   Remembering appts:  Daily problems with thinking and/or memory: Ad8 score is=0    Hearing Screening Comments: Wears hearing aids and is doing fine with them [place] earing aids  Vision Screening Comments: Vision issues  OS some irritation Going to [facility]  Last eye exam in July   Retinal specialist in March; freckle on eye     Immunization History  Administered Date(s) Administered  . Influenza Split 10/04/2011, 09/12/2012  . Influenza Whole 10/22/2009, 09/03/2010  . Influenza, High Dose Seasonal PF 09/05/2013, 09/12/2015, 09/06/2016, 09/14/2017  . Influenza,inj,Quad PF,6+ Mos 09/06/2014  . Pneumococcal Conjugate-13 09/06/2014  . Pneumococcal Polysaccharide-23 10/22/2009  . Td 09/25/2008      Screening Tests Health Maintenance  Topic Date Due  . TETANUS/TDAP  09/25/2018  . MAMMOGRAM  03/08/2019  . COLONOSCOPY  07/20/2021  . INFLUENZA VACCINE  Completed  . DEXA SCAN  Completed  . Hepatitis C Screening  Completed  . PNA vac Low Risk Adult  Completed         Plan:     PCP Notes   Health Maintenance Educated regarding Shingrix Educated regarding Tdap (due 08/2018) but can take anytime   Colonoscopy 06/2016 - repeat in 5 years  Bone density; 05/2016  -2.1 ( on Fosamax) (taking calcium and Vit D) Mammogram 02/2017 Pap 2014 Dr.Richardson is her GYN and still going  Discussed continuing to go and given information but is her personal choice Can discuss with Dr. Yong Channel   Abnormal Screens  none  Referrals  none  Patient concerns; Has raised area in the right upper cheek x 2 days. States it is red; looks like a pimple at this time, but if it changes or continues on or get worse to call Dr. Yong Channel or go to see Dermatologist   Also states left eye has been watery, irritated. Recommended eye exam. Does have dry eyes.   Recovered well from torn mensicus early last year due to missing a step No other falls since.    Nurse Concerns; As noted   Next PCP apt 06/21/2018     I have personally reviewed and noted the following in the patient's chart:   . Medical and social history . Use of alcohol, tobacco or illicit drugs  . Current medications and supplements . Functional ability and status . Nutritional status . Physical activity . Advanced directives . List of other physicians . Hospitalizations, surgeries, and ER visits in previous 12 months . Vitals . Screenings to include cognitive, depression, and falls . Referrals and appointments  In addition, I have reviewed and discussed with patient certain preventive protocols, quality metrics, and best practice recommendations. A written personalized care plan for preventive services as well as general preventive health recommendations were provided to patient.     Wynetta Fines, RN  12/01/2017

## 2017-12-01 NOTE — Progress Notes (Signed)
I have reviewed and agree with note, evaluation, plan. Agree to follow up recommendations if dermatological issue does not improve.   Garret Reddish, MD

## 2018-02-10 ENCOUNTER — Other Ambulatory Visit: Payer: Self-pay | Admitting: Family Medicine

## 2018-02-10 DIAGNOSIS — Z1231 Encounter for screening mammogram for malignant neoplasm of breast: Secondary | ICD-10-CM

## 2018-02-16 DIAGNOSIS — G43109 Migraine with aura, not intractable, without status migrainosus: Secondary | ICD-10-CM | POA: Insufficient documentation

## 2018-03-08 ENCOUNTER — Ambulatory Visit
Admission: RE | Admit: 2018-03-08 | Discharge: 2018-03-08 | Disposition: A | Discharge status: Home / Self Care | Payer: Medicare Other | Source: Ambulatory Visit | Attending: Family Medicine | Admitting: Family Medicine

## 2018-03-08 ENCOUNTER — Ambulatory Visit: Payer: Medicare Other

## 2018-03-08 DIAGNOSIS — Z1231 Encounter for screening mammogram for malignant neoplasm of breast: Secondary | ICD-10-CM

## 2018-03-08 LAB — HM MAMMOGRAPHY

## 2018-04-21 ENCOUNTER — Encounter: Payer: Self-pay | Admitting: Family Medicine

## 2018-05-24 ENCOUNTER — Telehealth: Payer: Self-pay | Admitting: Family Medicine

## 2018-05-24 NOTE — Telephone Encounter (Signed)
Copied from [place] [phone]. Topic: Quick Communication - See Telephone Encounter >> May 24, 2018 11:52 AM Marja Kays F wrote: [facility] [address] is calling to get a refill on alendronate 70mg  tablet  Best number [phone]

## 2018-05-25 ENCOUNTER — Other Ambulatory Visit: Payer: Self-pay

## 2018-05-25 MED ORDER — ALENDRONATE SODIUM 70 MG PO TABS: 70.0000 mg | ORAL_TABLET | ORAL | 11 refills | Status: DC

## 2018-05-25 NOTE — Telephone Encounter (Signed)
Disp Refills Start End   alendronate (FOSAMAX) 70 MG tablet 4 tablet 11 05/25/2018    Sig - Route: Take 1 tablet (70 mg total) by mouth every 7 (seven) days. Take with a full glass of water on an empty stomach. - Oral   Sent to pharmacy as: alendronate (FOSAMAX) 70 MG tablet   E-Prescribing Status: Receipt confirmed by pharmacy (05/25/2018 11:33 AM EDT)    / Medication filled already

## 2018-06-21 ENCOUNTER — Ambulatory Visit (INDEPENDENT_AMBULATORY_CARE_PROVIDER_SITE_OTHER): Payer: Medicare Other | Admitting: Family Medicine

## 2018-06-21 ENCOUNTER — Encounter: Payer: Self-pay | Admitting: Family Medicine

## 2018-06-21 VITALS — BP 106/66 | HR 60 | Temp 97.6°F | Ht 63.0 in | Wt 129.4 lb

## 2018-06-21 DIAGNOSIS — R319 Hematuria, unspecified: Secondary | ICD-10-CM | POA: Diagnosis not present

## 2018-06-21 DIAGNOSIS — L501 Idiopathic urticaria: Secondary | ICD-10-CM

## 2018-06-21 DIAGNOSIS — E785 Hyperlipidemia, unspecified: Secondary | ICD-10-CM | POA: Diagnosis not present

## 2018-06-21 DIAGNOSIS — Z0000 Encounter for general adult medical examination without abnormal findings: Secondary | ICD-10-CM | POA: Diagnosis not present

## 2018-06-21 DIAGNOSIS — M810 Age-related osteoporosis without current pathological fracture: Secondary | ICD-10-CM

## 2018-06-21 LAB — LIPID PANEL
CHOL/HDL RATIO: 3
Cholesterol: 192 mg/dL (ref 0–200)
HDL: 59.4 mg/dL (ref >39.00)
LDL CALC: 107 mg/dL — AB (ref 0–99)
NonHDL: 132.27
TRIGLYCERIDES: 127 mg/dL (ref 0.0–149.0)
VLDL: 25.4 mg/dL (ref 0.0–40.0)

## 2018-06-21 LAB — COMPREHENSIVE METABOLIC PANEL
ALT: 24 U/L (ref 0–35)
AST: 23 U/L (ref 0–37)
Albumin: 4.3 g/dL (ref 3.5–5.2)
Alkaline Phosphatase: 67 U/L (ref 39–117)
BUN: 26 mg/dL — ABNORMAL HIGH (ref 6–23)
CALCIUM: 9.6 mg/dL (ref 8.4–10.5)
CO2: 29 meq/L (ref 19–32)
Chloride: 104 mEq/L (ref 96–112)
Creatinine, Ser: 0.88 mg/dL (ref 0.40–1.20)
GFR: 66.74 mL/min (ref >60.00)
Glucose, Bld: 95 mg/dL (ref 70–99)
POTASSIUM: 4.5 meq/L (ref 3.5–5.1)
Sodium: 140 mEq/L (ref 135–145)
Total Bilirubin: 0.5 mg/dL (ref 0.2–1.2)
Total Protein: 7.6 g/dL (ref 6.0–8.3)

## 2018-06-21 LAB — CBC
HEMATOCRIT: 42.2 % (ref 36.0–46.0)
HEMOGLOBIN: 14.3 g/dL (ref 12.0–15.0)
MCHC: 34 g/dL (ref 30.0–36.0)
MCV: 90.9 fl (ref 78.0–100.0)
PLATELETS: 236 10*3/uL (ref 150.0–400.0)
RBC: 4.65 Mil/uL (ref 3.87–5.11)
RDW: 14.3 % (ref 11.5–15.5)
WBC: 5.9 10*3/uL (ref 4.0–10.5)

## 2018-06-21 LAB — URINALYSIS, MICROSCOPIC ONLY

## 2018-06-21 NOTE — Patient Instructions (Addendum)
Please check at home for date of last shingrix vaccine and send [place] a message- we will update your chart  Trochanteric bursitis on exam- advil is fine. You could also use the voltaren gel instead of the advil if it is helpful. I want you to ice your right hip 3x a day for 20 minutes. After 3 days I want you to try heat 3x a day for 20 minutes.   Our team will also give you a handout on this with some exercises which you can do 3x a week as long as pain <1-2/10. After a month can go to once a week to help.   If you fail to improve we could get you in with physical therapy or with our excellent sports medicine physician Dr. Paulla Fore to consider next steps such as injection  Please stop by the lab before you go.

## 2018-06-21 NOTE — Assessment & Plan Note (Signed)
HLD- risk under 7.9% in 2017. Son had MI- she prefers to stay off statin as long as risk under 10%. She is off fish oil. She states hasnt eaten the best- we will update lipids and agreed to consider statin if her 10 year risk is over 10%

## 2018-06-21 NOTE — Assessment & Plan Note (Signed)
Osteoporosis- placed on fosamax based on FRAX score on dexa last year. Came off worried that it was causing the rash- but rash did not resolve- we retrialed med last year- she hasnt had recurrence of rash (saw dermatology) Continue calcium and vitamin D. Will plan on bone density next year

## 2018-06-21 NOTE — Progress Notes (Signed)
Phone: [phone]  Subjective:  Patient presents today for their annual physical. Chief complaint-noted.   See problem oriented charting- ROS- full  review of systems was completed and negative except for:  Congestion, drooling, post nasal drip, runny nose, sinus pressure, sneezing, joint pain, headaches  The following were reviewed and entered/updated in epic: Past Medical History:  Diagnosis Date  . Allergy   . Arthritis   . Hyperlipidemia   . Neck pain, chronic   . 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 Rosacea    metrogel in past   Patient Active Problem List   Diagnosis Date Noted  . Idiopathic urticaria 06/16/2017    Priority: Medium  . Hyperlipidemia 09/05/2006    Priority: Medium  . Osteoporosis 11/15/2014    Priority: Low  . Allergic rhinitis 11/15/2014    Priority: Low  . History of basal cell cancer 11/15/2014    Priority: Low  . Rosacea     Priority: Low  . Osteoarthritis 11/18/2011    Priority: Low  . Hematuria 09/25/2008    Priority: Low  . Bilateral shoulder pain 11/15/2014   Past Surgical History:  Procedure Laterality Date  . bcc excision     x3  . CHOLECYSTECTOMY    . COLONOSCOPY WITH PROPOFOL N/A 07/20/2016   Procedure: COLONOSCOPY WITH PROPOFOL;  Surgeon: Garlan Fair, MD;  Location: [facility];  Service: Endoscopy;  Laterality: N/A;  . EYE SURGERY  2011   bilateral cataract with lens implant  . HYSTEROSCOPY    . KNEE SURGERY  2018   arthroscopic for meniscal tear  . mohr's surgery     face  . TONSILLECTOMY    . TUBAL LIGATION      Family History  Problem Relation Age of Onset  . Alzheimer's disease Mother        died of bowel rupture  . Cancer Father        unknown  . CAD Son        79- died of MI    Medications- reviewed and updated Current Outpatient Medications  Medication Sig Dispense Refill  . alendronate (FOSAMAX) 70 MG tablet Take 1 tablet (70 mg total) by mouth every 7 (seven)  days. Take with a full glass of water on an empty stomach. 4 tablet 11  . calcium carbonate (OS-CAL) 600 MG TABS Take 600 mg by mouth 2 (two) times daily with a meal.      . fluticasone (FLONASE) 50 MCG/ACT nasal spray Place 2 sprays into both nostrils daily. 16 g 6  . Glucosamine-Chondroit-Vit C-Mn (GLUCOSAMINE CHONDR 1500 COMPLX) CAPS Take 1 capsule by mouth daily.    Marland Kitchen loratadine (CLARITIN) 10 MG tablet Take 10 mg by mouth daily.    . Magnesium 250 MG TABS Take 1 tablet by mouth daily.      . minoxidil (ROGAINE) 2 % external solution Apply topically daily.    . montelukast (SINGULAIR) 10 MG tablet Take one tablet once daily as directed 90 tablet 0   No current facility-administered medications for this visit.     Allergies-reviewed and updated Allergies  Allergen Reactions  . Latex Rash    Social History   Social History Narrative   Married 1972. 1 living child daughter [name]. lost son 27 years old to [place] in 2017. No grandkids. 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     Objective: BP 106/66 (BP Location: Left Arm, Patient Position: Sitting, Cuff Size: Normal)   Pulse 60   Temp 97.6 F (36.4 C) (Oral)   Ht 5\' 3"  (1.6 m)   Wt 129 lb 6.4 oz (58.7 kg)   SpO2 97%   BMI 22.92 kg/m  Gen: NAD, resting comfortably HEENT: Mucous membranes are moist. Oropharynx normal Neck: no thyromegaly CV: RRR no murmurs rubs or gallops Lungs: CTAB no crackles, wheeze, rhonchi Abdomen: soft/nontender/nondistended/normal bowel sounds. No rebound or guarding.  Ext: no edema Skin: warm, dry Neuro: grossly normal, moves all extremities, PERRLA MSK: bilateral hip ROM normal and strength normal. No pain with palpation of left greater trochanter but has pain on palpation of right greater trochanter  Assessment/Plan:  72 y.o. female presenting for annual physical.  Health Maintenance  counseling: 1. Anticipatory guidance: Patient counseled regarding regular dental exams -q6 months, eye exams - every other year per optometry, wearing seatbelts.  2. Risk factor reduction:  Advised patient of need for regular exercise and diet rich and fruits and vegetables to reduce risk of heart attack and stroke. Exercise- walks a few miles a day 5 days a week plus water walks. Diet-has been pretty good other than a trip to [place].  Wt Readings from Last 3 Encounters:  06/21/18 129 lb 6.4 oz (58.7 kg)  12/01/17 128 lb (58.1 kg)  10/10/17 130 lb 6.4 oz (59.1 kg)  3. Immunizations/screenings/ancillary studies-  She will let us know when received 2nd shingrix Immunization History  Administered Date(s) Administered  . Influenza Split 10/04/2011, 09/12/2012  . Influenza Whole 10/22/2009, 09/03/2010  . Influenza, High Dose Seasonal PF 09/05/2013, 09/12/2015, 09/06/2016, 09/14/2017  . Influenza,inj,Quad PF,6+ Mos 09/06/2014  . Pneumococcal Conjugate-13 09/06/2014  . Pneumococcal Polysaccharide-23 10/22/2009  . Td 09/25/2008  . Zoster Recombinat (Shingrix) 03/02/2018  4. Cervical cancer screening-  still sees GYN but passed age based screening 5. Breast cancer screening-  breast exam with GYN and mammogram 03/08/18 6. Colon cancer screening - 07/20/16 with 5 year repeat planned due to adenoma 7. Skin cancer screening- sees derm yearly Dr. Martinique [place] [facility]- history basal cell- advised regular sunscreen use. Denies worrisome, changing, or new skin lesions.  8. Birth control/STD check- postmenopausal/monogamous 9. Osteoporosis screening at 24- have done- see osteoporosis problem list  Status of chronic or acute concerns   MSK 1. Trigger finger surgery right thumb with Dr. Fredna Dow- doing much better now 2. OA- neck, back, knees- has seen Dr. Ernestina Patches with [facility] in past. Started on voltaren gel for knees in April last year- she is now off . Had arthroscopic surgery too within last 2 years for  torn meniscus- doing well 3. Walked 20 miles at a conference this weekend. With this feels like right hip was giving out- better after resting Monday/tuesday.  advil helping as well.  On exam seems like trochanteric bursitis From avs "Trochanteric bursitis on exam- advil is fine. You could also use the voltaren gel instead of the advil if it is helpful. I want you to ice your right hip 3x a day for 20 minutes. After 3 days I want you to try heat 3x a day for 20 minutes.   Our team will also give you a handout on this with some exercises which you can do 3x a week as long as pain <1-2/10. After a month can go to once a week to help.   If you fail to improve we could  get you in with physical therapy or with our excellent sports medicine physician Dr. Paulla Fore to consider next steps such as injection"  Fatigue from last year now better  Hematuria- no RBC on urine microscopic's last 2 years- will test again  Idiopathic urticaria Idiopathic urticaria doing well on singulair and claritin once a day- sees allergist.   Hyperlipidemia HLD- risk under 7.9% in 2017. Son had MI- she prefers to stay off statin as long as risk under 10%. She is off fish oil. She states hasnt eaten the best- we will update lipids and agreed to consider statin if her 10 year risk is over 10%  Osteoporosis Osteoporosis- placed on fosamax based on FRAX score on dexa last year. Came off worried that it was causing the rash- but rash did not resolve- we retrialed med last year- she hasnt had recurrence of rash (saw dermatology) Continue calcium and vitamin D. Will plan on bone density next year   Return in about 1 year (around 06/22/2019) for physical.  Lab/Order associations:Fasting today Preventative health care - Plan: CBC, Comprehensive metabolic panel, Lipid panel, Urine Microscopic Only  Hyperlipidemia, unspecified hyperlipidemia type - Plan: CBC, Comprehensive metabolic panel, Lipid panel  Hematuria, unspecified type  - Plan: Urine Microscopic Only  Idiopathic urticaria  Age-related osteoporosis without current pathological fracture  Return precautions advised.  Garret Reddish, MD

## 2018-06-21 NOTE — Assessment & Plan Note (Signed)
Idiopathic urticaria doing well on singulair and claritin once a day- sees allergist.

## 2018-06-22 ENCOUNTER — Telehealth: Payer: Self-pay

## 2018-06-22 NOTE — Telephone Encounter (Signed)
Copied from [place] [phone]. Topic: Inquiry >> Jun 22, 2018 10:57 AM Oliver Pila B wrote: Reason for CRM: pt called to give pcp the date of her shingles vaccine which was 6.17.19; pt spoke of as well of her cholesterol she is still deciding on what treatment you want to pursue  Immunization record updated to reflect updated vaccines. Just an FYI regarding cholesterol treatment

## 2018-08-09 ENCOUNTER — Other Ambulatory Visit: Payer: Self-pay | Admitting: *Deleted

## 2018-08-09 ENCOUNTER — Other Ambulatory Visit: Payer: Self-pay | Admitting: Allergy and Immunology

## 2018-08-09 MED ORDER — MONTELUKAST SODIUM 10 MG PO TABS: ORAL_TABLET | ORAL | 0 refills | Status: DC

## 2018-08-09 NOTE — Telephone Encounter (Signed)
Rx sent in called patient advised courtesy refill was sent in and to keep appt. Patient verbalized understanding

## 2018-08-09 NOTE — Telephone Encounter (Signed)
Patient is requesting a refill for her Montelukast; [facility] [address]. She said her request was denied because she needed an appt. She made one for 09-12-18.

## 2018-09-06 ENCOUNTER — Ambulatory Visit (INDEPENDENT_AMBULATORY_CARE_PROVIDER_SITE_OTHER): Payer: Medicare Other

## 2018-09-06 DIAGNOSIS — Z23 Encounter for immunization: Secondary | ICD-10-CM | POA: Diagnosis not present

## 2018-09-06 NOTE — Progress Notes (Signed)
Patient in today for Flu Vaccine. VIS given. Administered in left arm. Patient tolerated well. 

## 2018-09-06 NOTE — Patient Instructions (Signed)
There are no preventive care reminders to display for this patient.  Depression screen [employer] 2/9 12/01/2017 12/03/2016 05/19/2016  Decreased Interest 0 0 0  Down, Depressed, Hopeless 0 0 0  PHQ - 2 Score 0 0 0

## 2018-09-06 NOTE — Progress Notes (Signed)
I have reviewed the patient's encounter and agree with the documentation.  Algis Greenhouse. Jerline Pain, MD 09/06/2018 12:36 PM

## 2018-09-12 ENCOUNTER — Ambulatory Visit: Payer: Medicare Other | Admitting: Allergy and Immunology

## 2018-09-12 ENCOUNTER — Encounter: Payer: Self-pay | Admitting: Allergy and Immunology

## 2018-09-12 VITALS — BP 122/72 | HR 72 | Resp 16

## 2018-09-12 DIAGNOSIS — H6983 Other specified disorders of Eustachian tube, bilateral: Secondary | ICD-10-CM | POA: Diagnosis not present

## 2018-09-12 DIAGNOSIS — L308 Other specified dermatitis: Secondary | ICD-10-CM

## 2018-09-12 DIAGNOSIS — J3089 Other allergic rhinitis: Secondary | ICD-10-CM

## 2018-09-12 DIAGNOSIS — L989 Disorder of the skin and subcutaneous tissue, unspecified: Secondary | ICD-10-CM

## 2018-09-12 MED ORDER — FLUTICASONE PROPIONATE 50 MCG/ACT NA SUSP: 2.0000 | Freq: Every day | NASAL | 11 refills | Status: DC

## 2018-09-12 MED ORDER — MONTELUKAST SODIUM 10 MG PO TABS: ORAL_TABLET | ORAL | 11 refills | Status: DC

## 2018-09-12 NOTE — Progress Notes (Signed)
Follow-up Note  Referring Provider: Marin Olp, MD Primary Provider: Marin Olp, MD Date of Office Visit: 09/12/2018  Subjective:   Rhonda [name] (DOB: 19-Mar-1946) is a 72 y.o. female who returns to the [facility] [place] on 09/12/2018 in re-evaluation of the following:  HPI: [name] returns to this clinic in reevaluation of her allergic rhinitis and ETD and inflammatory dermatosis.  Her last visit to this clinic was 12 July 2017.  During her last visit she showed significant improvement regarding her dermatitis and that has completely resolved at this point ever since she came off her fish oil and she was able to taper off her doxepin and is doing quite well with the use of Zyrtec only.  Occasionally she gets an itchy back but this is a minimal issue at this point.  Her nose is doing very good while she intermittently using Flonase.  Her ears have not been causing her any problem.  She did obtain a flu vaccine.  Allergies as of 09/12/2018      Reactions   Latex Rash      Medication List      alendronate 70 MG tablet Commonly known as:  FOSAMAX Take 1 tablet (70 mg total) by mouth every 7 (seven) days. Take with a full glass of water on an empty stomach.   calcium carbonate 600 MG Tabs tablet Commonly known as:  OS-CAL Take 600 mg by mouth 2 (two) times daily with a meal.   fluticasone 50 MCG/ACT nasal spray Commonly known as:  FLONASE Place 2 sprays into both nostrils daily.   GLUCOSAMINE CHONDR 1500 COMPLX Caps Take 1 capsule by mouth daily.   loratadine 10 MG tablet Commonly known as:  CLARITIN Take 10 mg by mouth daily.   Magnesium 250 MG Tabs Take 1 tablet by mouth daily.   minoxidil 2 % external solution Commonly known as:  ROGAINE Apply topically daily.   montelukast 10 MG tablet Commonly known as:  SINGULAIR Take one tablet once daily as directed       Past Medical History:  Diagnosis Date  . Allergy   . Arthritis     . Hyperlipidemia   . Neck pain, chronic   . 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 Rosacea    metrogel in past    Past Surgical History:  Procedure Laterality Date  . bcc excision     x3  . CHOLECYSTECTOMY    . COLONOSCOPY WITH PROPOFOL N/A 07/20/2016   Procedure: COLONOSCOPY WITH PROPOFOL;  Surgeon: Garlan Fair, MD;  Location: [facility];  Service: Endoscopy;  Laterality: N/A;  . EYE SURGERY  2011   bilateral cataract with lens implant  . HYSTEROSCOPY    . KNEE SURGERY  2018   arthroscopic for meniscal tear  . mohr's surgery     face  . TONSILLECTOMY    . TUBAL LIGATION      Review of systems negative except as noted in HPI / PMHx or noted below:  Review of Systems  Constitutional: Negative.   HENT: Negative.   Eyes: Negative.   Respiratory: Negative.   Cardiovascular: Negative.   Gastrointestinal: Negative.   Genitourinary: Negative.   Musculoskeletal: Negative.   Skin: Negative.   Neurological: Negative.   Endo/Heme/Allergies: Negative.   Psychiatric/Behavioral: Negative.      Objective:   Vitals:   09/12/18 1638  BP: 122/72  Pulse: 72  Resp: 16  Physical Exam  HENT:  Head: Normocephalic.  Right Ear: Tympanic membrane, external ear and ear canal normal.  Left Ear: Tympanic membrane, external ear and ear canal normal.  Nose: Nose normal. No mucosal edema or rhinorrhea.  Mouth/Throat: Uvula is midline, oropharynx is clear and moist and mucous membranes are normal. No oropharyngeal exudate.  Eyes: Conjunctivae are normal.  Neck: Trachea normal. No tracheal tenderness present. No tracheal deviation present. No thyromegaly present.  Cardiovascular: Normal rate, regular rhythm, S1 normal, S2 normal and normal heart sounds.  No murmur heard. Pulmonary/Chest: Breath sounds normal. No stridor. No respiratory distress. She has no wheezes. She has no rales.  Musculoskeletal: She exhibits no edema.   Lymphadenopathy:       Head (right side): No tonsillar adenopathy present.       Head (left side): No tonsillar adenopathy present.    She has no cervical adenopathy.  Neurological: She is alert.  Skin: No rash noted. She is not diaphoretic. No erythema. Nails show no clubbing.    Diagnostics: none  Assessment and Plan:   1. Inflammatory dermatosis   2. Other allergic rhinitis   3. Dysfunction of both eustachian tubes     1. Continue Zyrtec 10mg  daily   2. Continue montelukast 10 mg one tablet one time per day   3. Continue Flonase 1-2 sprays each nostril 1-7 times per week  4. Return to clinic in 12 months or earlier if problem  [name] is really doing very well at this point in time and she will continue to use a leukotriene modifier and a nasal steroid to address inflammation of her upper airway and ears and she can continue Zyrtec for her minimal pruritic disorder since resolution of her inflammatory dermatitis.  I will see her back in this clinic in 1 year or earlier if there is a problem.  [name], MD Allergy / Immunology [place]

## 2018-09-12 NOTE — Patient Instructions (Addendum)
  1. Continue Zyrtec 10mg  daily   2. Continue montelukast 10 mg one tablet one time per day   3. Continue Flonase 1-2 sprays each nostril 1-7 times per week  4. Return to clinic in 12 months or earlier if problem

## 2018-09-13 ENCOUNTER — Encounter: Payer: Self-pay | Admitting: Allergy and Immunology

## 2018-12-22 ENCOUNTER — Ambulatory Visit: Payer: Medicare Other | Admitting: Physician Assistant

## 2018-12-22 ENCOUNTER — Emergency Department (HOSPITAL_COMMUNITY)
Admission: EM | Admit: 2018-12-22 | Discharge: 2018-12-22 | Disposition: A | Discharge status: Home / Self Care | Payer: Medicare Other | Attending: Emergency Medicine | Admitting: Emergency Medicine

## 2018-12-22 ENCOUNTER — Encounter (HOSPITAL_COMMUNITY): Payer: Self-pay | Admitting: Emergency Medicine

## 2018-12-22 ENCOUNTER — Other Ambulatory Visit: Payer: Self-pay

## 2018-12-22 ENCOUNTER — Emergency Department (HOSPITAL_COMMUNITY): Payer: Medicare Other

## 2018-12-22 ENCOUNTER — Encounter: Payer: Self-pay | Admitting: Physician Assistant

## 2018-12-22 VITALS — BP 130/80 | HR 85 | Temp 97.6°F | Ht 63.0 in | Wt 129.0 lb

## 2018-12-22 DIAGNOSIS — R072 Precordial pain: Secondary | ICD-10-CM | POA: Insufficient documentation

## 2018-12-22 DIAGNOSIS — R0789 Other chest pain: Secondary | ICD-10-CM

## 2018-12-22 DIAGNOSIS — Z79899 Other long term (current) drug therapy: Secondary | ICD-10-CM | POA: Diagnosis not present

## 2018-12-22 DIAGNOSIS — Z87891 Personal history of nicotine dependence: Secondary | ICD-10-CM | POA: Diagnosis not present

## 2018-12-22 LAB — BASIC METABOLIC PANEL
Anion gap: 9 (ref 5–15)
BUN: 20 mg/dL (ref 8–23)
CALCIUM: 9.7 mg/dL (ref 8.9–10.3)
CO2: 26 mmol/L (ref 22–32)
Chloride: 106 mmol/L (ref 98–111)
Creatinine, Ser: 0.81 mg/dL (ref 0.44–1.00)
GFR calc Af Amer: >60 mL/min (ref >60)
GFR calc non Af Amer: >60 mL/min (ref >60)
Glucose, Bld: 98 mg/dL (ref 70–99)
Potassium: 3.9 mmol/L (ref 3.5–5.1)
SODIUM: 141 mmol/L (ref 135–145)

## 2018-12-22 LAB — CBC
HCT: 45.3 % (ref 36.0–46.0)
Hemoglobin: 14.8 g/dL (ref 12.0–15.0)
MCH: 31.1 pg (ref 26.0–34.0)
MCHC: 32.7 g/dL (ref 30.0–36.0)
MCV: 95.2 fL (ref 80.0–100.0)
Platelets: 245 10*3/uL (ref 150–400)
RBC: 4.76 MIL/uL (ref 3.87–5.11)
RDW: 13.3 % (ref 11.5–15.5)
WBC: 6.3 10*3/uL (ref 4.0–10.5)
nRBC: 0 % (ref 0.0–0.2)

## 2018-12-22 LAB — POCT I-STAT TROPONIN I
TROPONIN I, POC: 0 ng/mL (ref 0.00–0.08)
Troponin i, poc: 0 ng/mL (ref 0.00–0.08)

## 2018-12-22 MED ORDER — SODIUM CHLORIDE 0.9% FLUSH: 3.0000 mL | Freq: Once | INTRAVENOUS | Status: DC

## 2018-12-22 NOTE — ED Provider Notes (Signed)
[facility] DEPT Provider Note   CSN: [id number] Arrival date & time: 12/22/18  1114     History   Chief Complaint Chief Complaint  Patient presents with  . Chest Pain    HPI Rhonda [name] is a 73 y.o. female.  Patient with no past cardiac history presents the emergency department today with complaint of ongoing tight sensation in her mid chest.  Patient states that this has been constant from last 3 weeks since she found out that her daughter had breast cancer.  Patient denies associated shortness of breath.  She states that she continues to take walks in the water aerobics without any shortness of breath or decreased exercise tolerance.  Exercise does not take her chest symptoms worse.  She denies fever or cough.  No diaphoresis, lightheadedness, vomiting.  Patient denies abdominal pain.  Patient saw her primary care doctor today and was sent to the emergency department for cardiac work-up given abnormal EKG.  Unfortunately there are no old EKGs in the patient's record.  She reports having a remote echocardiogram.  She was told in the past that she had a bundle branch block.  No treatments prior to arrival.  Patient thinks that her symptoms may be related to anxiety.  She is a former smoker.  No history of hypertension, high cholesterol, diabetes. No h/o PE. Patient denies risk factors for pulmonary embolism including: unilateral leg swelling, history of DVT/PE/other blood clots, use of exogenous hormones, recent immobilizations, recent surgery, recent travel (>4hr segment), malignancy, hemoptysis.       Past Medical History:  Diagnosis Date  . Allergy   . Arthritis   . Hyperlipidemia   . Neck pain, chronic   . 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 Rosacea    metrogel in past    Patient Active Problem List   Diagnosis Date Noted  . Idiopathic urticaria 06/16/2017  . Osteoporosis 11/15/2014  . Allergic  rhinitis 11/15/2014  . History of basal cell cancer 11/15/2014  . Bilateral shoulder pain 11/15/2014  . Rosacea   . Osteoarthritis 11/18/2011  . Hematuria 09/25/2008  . Hyperlipidemia 09/05/2006    Past Surgical History:  Procedure Laterality Date  . bcc excision     x3  . CHOLECYSTECTOMY    . COLONOSCOPY WITH PROPOFOL N/A 07/20/2016   Procedure: COLONOSCOPY WITH PROPOFOL;  Surgeon: Garlan Fair, MD;  Location: [facility];  Service: Endoscopy;  Laterality: N/A;  . EYE SURGERY  2011   bilateral cataract with lens implant  . HYSTEROSCOPY    . KNEE SURGERY  2018   arthroscopic for meniscal tear  . mohr's surgery     face  . TONSILLECTOMY    . TUBAL LIGATION       OB History   No obstetric history on file.      Home Medications    Prior to Admission medications   Medication Sig Start Date End Date Taking? Authorizing Provider  alendronate (FOSAMAX) 70 MG tablet Take 1 tablet (70 mg total) by mouth every 7 (seven) days. Take with a full glass of water on an empty stomach. 05/25/18   Marin Olp, MD  calcium carbonate (OS-CAL) 600 MG TABS Take 600 mg by mouth 2 (two) times daily with a meal.      [provider]  fluticasone (FLONASE) 50 MCG/ACT nasal spray Place 2 sprays into both nostrils daily. 09/12/18   Kozlow, Donnamarie Poag, MD  Glucosamine-Chondroit-Vit  C-Mn (GLUCOSAMINE CHONDR 1500 COMPLX) CAPS Take 1 capsule by mouth daily.    [provider]  loratadine (CLARITIN) 10 MG tablet Take 10 mg by mouth daily.    [provider]  minoxidil (ROGAINE) 2 % external solution Apply topically daily.    [provider]  montelukast (SINGULAIR) 10 MG tablet Take one tablet once daily as directed 09/12/18   Kozlow, Donnamarie Poag, MD    Family History Family History  Problem Relation Age of Onset  . Alzheimer's disease Mother        died of bowel rupture  . Cancer Father        unknown  . CAD Son        95- died of MI    Social  History Social History   Tobacco Use  . Smoking status: Former Smoker    Packs/day: 0.75    Years: 10.00    Pack years: 7.50    Types: Cigarettes    Last attempt to quit: 1972    Years since quitting: 48.0  . Smokeless tobacco: Never Used  . Tobacco comment: quit at age 73  Substance Use Topics  . Alcohol use: Yes    Alcohol/week: 7.0 standard drinks    Types: 7 Glasses of wine per week    Comment: one glass of wine a day  . Drug use: No     Allergies   Latex   Review of Systems Review of Systems  Constitutional: Negative for diaphoresis and fever.  Eyes: Negative for redness.  Respiratory: Negative for cough and shortness of breath.   Cardiovascular: Positive for chest pain. Negative for palpitations and leg swelling.  Gastrointestinal: Negative for abdominal pain, nausea and vomiting.  Genitourinary: Negative for dysuria.  Musculoskeletal: Negative for back pain and neck pain.  Skin: Negative for rash.  Neurological: Negative for syncope and light-headedness.  Psychiatric/Behavioral: The patient is nervous/anxious.      Physical Exam Updated Vital Signs BP 123/65 (BP Location: Left Arm)   Pulse 69   Temp 97.8 F (36.6 C) (Oral)   Resp 16   Ht 5\' 3"  (1.6 m)   Wt 58.5 kg   SpO2 97%   BMI 22.85 kg/m   Physical Exam Vitals signs and nursing note reviewed.  Constitutional:      Appearance: She is well-developed. She is not diaphoretic.  HENT:     Head: Normocephalic and atraumatic.     Mouth/Throat:     Mouth: Mucous membranes are not dry.  Eyes:     Conjunctiva/sclera: Conjunctivae normal.  Neck:     Musculoskeletal: Normal range of motion and neck supple. No muscular tenderness.     Vascular: Normal carotid pulses. No carotid bruit or JVD.     Trachea: Trachea normal. No tracheal deviation.  Cardiovascular:     Rate and Rhythm: Normal rate and regular rhythm.     Pulses: No decreased pulses.     Heart sounds: Normal heart sounds, S1 normal and S2  normal. No murmur.  Pulmonary:     Effort: Pulmonary effort is normal. No respiratory distress.     Breath sounds: No wheezing.  Chest:     Chest wall: No tenderness.  Abdominal:     General: Bowel sounds are normal.     Palpations: Abdomen is soft.     Tenderness: There is no abdominal tenderness. There is no guarding or rebound.  Musculoskeletal: Normal range of motion.  Skin:    General: Skin is  warm and dry.     Coloration: Skin is not pale.     Comments: Pt becomes tearful when discussing daughter.   Neurological:     Mental Status: She is alert.      ED Treatments / Results  Labs (all labs ordered are listed, but only abnormal results are displayed) Labs Reviewed  BASIC METABOLIC PANEL  CBC  I-STAT TROPONIN, ED  POCT I-STAT TROPONIN I  I-STAT TROPONIN, ED  POCT I-STAT TROPONIN I    Radiology Dg Chest 2 View  Result Date: 12/22/2018 CLINICAL DATA:  Chest pain EXAM: CHEST - 2 VIEW COMPARISON:  04/30/2009 FINDINGS: Artifact from EKG leads. Calcified granuloma in the left lower lobe and chronic sclerotic density at the left first costochondral junction period. There is no edema, consolidation, effusion, or pneumothorax. Normal heart size and mediastinal contours. Cholecystectomy clips. Diffuse thoracic disc narrowing and ridging with mild scoliosis. IMPRESSION: No acute finding. Electronically Signed   By: Monte Fantasia M.D.   On: 12/22/2018 11:58    Procedures Procedures (including critical care time)  Medications Ordered in ED Medications  sodium chloride flush (NS) 0.9 % injection 3 mL (3 mLs Intravenous Not Given 12/22/18 1320)     Initial Impression / Assessment and Plan / ED Course  I have reviewed the triage vital signs and the nursing notes.  Pertinent labs & imaging results that were available during my care of the patient were reviewed by me and considered in my medical decision making (see chart for details).     Patient seen and examined.  Discussed with and seen by Dr. Ronnald Nian. Will check delta troponin. EKE reviewed. RBBB is likely chronic. Otherwise non-specific t-wave changes.   Vital signs reviewed and are as follows: BP 123/65 (BP Location: Left Arm)   Pulse 69   Temp 97.8 F (36.6 C) (Oral)   Resp 16   Ht 5\' 3"  (1.6 m)   Wt 58.5 kg   SpO2 97%   BMI 22.85 kg/m   3:36 PM second troponin is negative.  Patient updated.  We discussed results.  Cardiology referral given.  Patient was counseled to return with severe chest pain, especially if the pain is crushing or pressure-like and spreads to the arms, back, neck, or jaw, or if they have sweating, nausea, or shortness of breath with the pain. They were encouraged to call 911 with these symptoms.   The patient verbalized understanding and agreed.    Final Clinical Impressions(s) / ED Diagnoses   Final diagnoses:  Precordial pain   Patient with chest pain that is been constant for the past 3 weeks.  Symptoms are atypical.  Patient with minimal risk factors other than age.  Troponin negative x2.  EKG with nonspecific T wave abnormality and right bundle branch block.  Doubt acute ischemia.  Comfortable with discharge at this time with PCP/cardiology follow-up.  ED Discharge Orders    None       Carlisle Cater, PA-C 12/22/18 [place], [place], DO 12/22/18 1540

## 2018-12-22 NOTE — ED Triage Notes (Signed)
Pt c/o mid chest pain that has been constant for 3 weeks since finding out that her daughter has breast cancer. Saw PCP today who advised to go to ED.

## 2018-12-22 NOTE — Progress Notes (Signed)
Rhonda [name] is a 73 y.o. female here for a new problem.  I acted as a Education administrator for [employer], PA-C Rhonda [name], Rhonda [name]   History of Present Illness:   Chief Complaint  Patient presents with  . Chest Pain    HPI  Chest tightness Pt c/o chest tightness x 3 weeks constantly. Daughter was just diagnosed with breast cancer 3 weeks ago when this started. Pt denies left arm pain, numbness or tingling. Describes pain as mid-sub sternal. States that it does not change with activity. Has been to water aerobics and walking at the gym with this.  The 10-year ASCVD risk score Rhonda [name] Rhonda [name] Rhonda [name]., et al., 2013) is: 14.8%   Values used to calculate the score:     Age: 78 years     Sex: Female     Is Non-Hispanic African American: No     Diabetic: No     Tobacco smoker: No     Systolic Blood Pressure: 672 mmHg     Is BP treated: No     HDL Cholesterol: 59.4 mg/dL     Total Cholesterol: 192 mg/dL  Last lipid panel check on 05/2018 and she was recommended to start atorvastatin 20 mg however she declined.  She is a former smoker.  BP Readings from Last 3 Encounters:  12/22/18 130/80  09/12/18 122/72  06/21/18 106/66    Past Medical History:  Diagnosis Date  . Allergy   . Arthritis   . Hyperlipidemia   . Neck pain, chronic   . 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 Rosacea    metrogel in past     Social History   Socioeconomic History  . Marital status: Married    Spouse name: Not on file  . Number of children: Not on file  . Years of education: Not on file  . Highest education level: Not on file  Occupational History  . Not on file  Social Needs  . Financial resource strain: Not on file  . Food insecurity:    Worry: Not on file    Inability: Not on file  . Transportation needs:    Medical: Not on file    Non-medical: Not on file  Tobacco Use  . Smoking status: Former Smoker    Packs/day: 0.75    Years: 10.00    Pack years: 7.50    Types: Cigarettes    Last attempt to quit: 1972    Years since quitting: 48.0  . Smokeless tobacco: Never Used  . Tobacco comment: quit at age 54  Substance and Sexual Activity  . Alcohol use: Yes    Alcohol/week: 7.0 standard drinks    Types: 7 Glasses of wine per week    Comment: one glass of wine a day  . Drug use: No  . Sexual activity: Not on file  Lifestyle  . Physical activity:    Days per week: Not on file    Minutes per session: Not on file  . Stress: Not on file  Relationships  . Social connections:    Talks on phone: Not on file    Gets together: Not on file    Attends religious service: Not on file    Active member of club or organization: Not on file    Attends meetings of clubs or organizations: Not on file    Relationship status: Not on file  . Intimate partner violence:    Fear of current  or ex partner: Not on file    Emotionally abused: Not on file    Physically abused: Not on file    Forced sexual activity: Not on file  Other Topics Concern  . Not on file  Social History Narrative   Married 1972. 1 living child daughter Rhonda [name]. lost son 52 years old to [place] in 2017. No grandkids. 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     Past Surgical History:  Procedure Laterality Date  . bcc excision     x3  . CHOLECYSTECTOMY    . COLONOSCOPY WITH PROPOFOL N/A 07/20/2016   Procedure: COLONOSCOPY WITH PROPOFOL;  Surgeon: Garlan Fair, MD;  Location: [facility];  Service: Endoscopy;  Laterality: N/A;  . EYE SURGERY  2011   bilateral cataract with lens implant  . HYSTEROSCOPY    . KNEE SURGERY  2018   arthroscopic for meniscal tear  . mohr's surgery     face  . TONSILLECTOMY    . TUBAL LIGATION      Family History  Problem Relation Age of Onset  . Alzheimer's disease Mother        died of bowel rupture  . Cancer Father        unknown  . CAD  Son        76- died of MI    Allergies  Allergen Reactions  . Latex Rash    Current Medications:   Current Outpatient Medications:  .  alendronate (FOSAMAX) 70 MG tablet, Take 1 tablet (70 mg total) by mouth every 7 (seven) days. Take with a full glass of water on an empty stomach., Disp: 4 tablet, Rfl: 11 .  calcium carbonate (OS-CAL) 600 MG TABS, Take 600 mg by mouth 2 (two) times daily with a meal.  , Disp: , Rfl:  .  fluticasone (FLONASE) 50 MCG/ACT nasal spray, Place 2 sprays into both nostrils daily., Disp: 16 g, Rfl: 11 .  Glucosamine-Chondroit-Vit C-Mn (GLUCOSAMINE CHONDR 1500 COMPLX) CAPS, Take 1 capsule by mouth daily., Disp: , Rfl:  .  loratadine (CLARITIN) 10 MG tablet, Take 10 mg by mouth daily., Disp: , Rfl:  .  minoxidil (ROGAINE) 2 % external solution, Apply topically daily., Disp: , Rfl:  .  montelukast (SINGULAIR) 10 MG tablet, Take one tablet once daily as directed, Disp: 30 tablet, Rfl: 11   Review of Systems:   ROS  Negative unless otherwise specified per HPI.  Vitals:   Vitals:   12/22/18 0949  BP: 130/80  Pulse: 85  Temp: 97.6 F (36.4 C)  TempSrc: Oral  SpO2: 99%  Weight: 129 lb (58.5 kg)  Height: 5\' 3"  (1.6 m)     Body mass index is 22.85 kg/m.  Physical Exam:   Physical Exam Vitals signs and nursing note reviewed.  Constitutional:      General: She is not in acute distress.    Appearance: She is well-developed. She is not ill-appearing or toxic-appearing.  Cardiovascular:     Rate and Rhythm: Normal rate and regular rhythm.     Pulses: Normal pulses.     Heart sounds: Normal heart sounds, S1 normal and S2 normal.     Comments: No LE edema Pulmonary:     Effort: Pulmonary effort is normal.     Breath sounds: Normal breath sounds.  Skin:    General: Skin is warm and  dry.  Neurological:     Mental Status: She is alert.     GCS: GCS eye subscore is 4. GCS verbal subscore is 5. GCS motor subscore is 6.  Psychiatric:        Speech:  Speech normal.        Behavior: Behavior normal. Behavior is cooperative.    EKG tracing is personally reviewed.  EKG notes NSR with RBBB. Does have T wave inversions in lead III, V1, V3, V4.   Assessment and Plan:   Rhonda [name] was seen today for chest pain.  Diagnoses and all orders for this visit:  Chest tightness -     EKG 12-Lead   EKG tracing is personally reviewed.  EKG notes NSR with RBBB. Does have T wave inversions in lead III, V1, V3, V4. No prior EKG available for me to review. Due to advanced age, elevated ASCVD, ongoing symptoms and EKG results -- will send to ER for more formal evaluation. Patient declined EMS transport. Discussed that if her symptoms worsen prior to arrival of ED to go call 911.  . Reviewed expectations re: course of current medical issues. . Discussed self-management of symptoms. . Outlined signs and symptoms indicating need for more acute intervention. . Patient verbalized understanding and all questions were answered. . See orders for this visit as documented in the electronic medical record. . Patient received an After-Visit Summary.  CMA or LPN served as scribe during this visit. History, Physical, and Plan performed by medical provider. The above documentation has been reviewed and is accurate and complete.  Rhonda Coke, PA-C

## 2018-12-22 NOTE — Discharge Instructions (Signed)
Please read and follow all provided instructions.  Your diagnoses today include:  1. Precordial pain     Tests performed today include:  An EKG of your heart  A chest x-ray  Cardiac enzymes - a blood test for heart muscle damage  Blood counts and electrolytes  Vital signs. See below for your results today.   Medications prescribed:   None  Take any prescribed medications only as directed.  Follow-up instructions: Please follow-up with your primary care provider as soon as you can for further evaluation of your symptoms.   Return instructions:  SEEK IMMEDIATE MEDICAL ATTENTION IF:  You have severe chest pain, especially if the pain is crushing or pressure-like and spreads to the arms, back, neck, or jaw, or if you have sweating, nausea (feeling sick to your stomach), or shortness of breath. THIS IS AN EMERGENCY. Don't wait to see if the pain will go away. Get medical help at once. Call 911 or 0 (operator). DO NOT drive yourself to the hospital.   Your chest pain gets worse and does not go away with rest.   You have an attack of chest pain lasting longer than usual, despite rest and treatment with the medications your caregiver has prescribed.   You wake from sleep with chest pain or shortness of breath.  You feel dizzy or faint.  You have chest pain not typical of your usual pain for which you originally saw your caregiver.   You have any other emergent concerns regarding your health.  Additional Information: Chest pain comes from many different causes. Your caregiver has diagnosed you as having chest pain that is not specific for one problem, but does not require admission.  You are at low risk for an acute heart condition or other serious illness.   Your vital signs today were: BP 123/65 (BP Location: Left Arm)    Pulse 69    Temp 97.8 F (36.6 C) (Oral)    Resp 16    Ht 5\' 3"  (1.6 m)    Wt 58.5 kg    SpO2 97%    BMI 22.85 kg/m  If your blood pressure (BP) was  elevated above 135/85 this visit, please have this repeated by your doctor within one month. --------------

## 2018-12-22 NOTE — ED Notes (Signed)
NO RX GIVEN 

## 2018-12-22 NOTE — ED Provider Notes (Addendum)
Medical screening examination/treatment/procedure(s) were conducted as a shared visit with non-physician practitioner(s) and myself.  I personally evaluated the patient during the encounter. Briefly, the patient is a 73 y.o. female with no significant medical history who presents to the ED with chest pain.  Patient with normal vitals.  No fever.  Patient with chest pain on and off for the last several weeks.  She has been extremely anxious about her family member that was just diagnosed with cancer.  She has no cardiac risk factors.  She does not smoke.  No significant family history.  No DVT or PE risk factors.  Wells criteria 0.  Doubt PE.  Patient had EKG that showed right bundle branch block but otherwise no ischemic findings.  Troponin was ordered x 2 that was negative. Heart score 1. Doubt ACS. No significant anemia, electrolyte abnormality, kidney injury.  Chest x-ray showed no signs of pneumonia, pneumothorax, pleural effusion.  Patient with clear breath sounds on exam.  Overall well-appearing.  She does appear mildly anxious.  At this time doubt any acute cardiac or pulmonary process.  Will give information to follow-up with cardiology for further outpatient work-up.  Recommend continued follow-up with primary care doctor.  Discharged from ED in good condition and given return precautions.  EKG shows sinus rhythm.  Right bundle branch block.  Normal intervals.  No significant ischemic findings.   Lennice Sites, DO 12/22/18 Slater, Henning, DO 12/22/18 1540

## 2018-12-22 NOTE — Patient Instructions (Signed)
It was great to see you!  Please go to the ER for further evaluation and treatment.  Take care,  Taria Castrillo PA-C  

## 2018-12-28 ENCOUNTER — Telehealth: Payer: Self-pay | Admitting: Family Medicine

## 2018-12-28 DIAGNOSIS — R0789 Other chest pain: Secondary | ICD-10-CM

## 2018-12-28 NOTE — Telephone Encounter (Signed)
See note  Copied from [place] [id number]. Topic: Referral - Request for Referral >> Dec 28, 2018  8:40 AM Sheran Luz wrote: Has patient seen PCP for this complaint? Yes Referral for which specialty: Cardiology  Preferred provider/office: [place] [facility] Reason for referral: Chest tightness- ED recommended she get referral from PCP

## 2018-12-28 NOTE — Telephone Encounter (Signed)
Called pt to get more information. Pt was not home at the time. Per pt husband she will be home around 4pm. Will try again later.

## 2018-12-28 NOTE — Telephone Encounter (Signed)
Patient returned phone call. Pt was advise that an approval had to be placed for cardio per Dr. Yong Channel. Pt was informed that Dr. Yong Channel will return to the office tomorrow and her message has been sent to him. Pt stated that was fine.

## 2018-12-29 NOTE — Addendum Note (Signed)
Addended by: Gwenyth Ober R on: 12/29/2018 11:44 AM   Modules accepted: Orders

## 2018-12-29 NOTE — Telephone Encounter (Signed)
Yes thanks-  May refer

## 2018-12-29 NOTE — Telephone Encounter (Signed)
Noted! Referral placed. Pt notified. No further action needed.

## 2019-01-11 ENCOUNTER — Encounter: Payer: Self-pay | Admitting: Family Medicine

## 2019-01-11 ENCOUNTER — Ambulatory Visit: Payer: Medicare Other | Admitting: Family Medicine

## 2019-01-11 VITALS — BP 110/62 | HR 76 | Temp 97.8°F | Ht 63.0 in | Wt 126.2 lb

## 2019-01-11 DIAGNOSIS — F439 Reaction to severe stress, unspecified: Secondary | ICD-10-CM | POA: Diagnosis not present

## 2019-01-11 MED ORDER — TRAZODONE HCL 50 MG PO TABS: 25.0000 mg | ORAL_TABLET | Freq: Every evening | ORAL | 1 refills | Status: DC | PRN

## 2019-01-11 NOTE — Patient Instructions (Addendum)
Health Maintenance Due  Topic Date Due  . TETANUS/TDAP Please stop by your pharmacy to get vaccination  09/25/2018   Trial trazodone if needed for sleep if perhaps you've had a few rough nights in a row , dont have to use every night. Use only half tablet to start- can use full tablet if needed   I love your idea of pursuing counseling- if its going to take more than 1-2 weeks- give me a call and I'll see if I can get you in sooner with Lattie Haw or send me a [employer]

## 2019-01-11 NOTE — Progress Notes (Signed)
Phone [phone]   Subjective:  Rhonda [name] is a 73 y.o. year old very pleasant female patient who presents for/with See problem oriented charting ROS- depressed mood.  No suicidal thoughts.  Notes anhedonia.  Poor sleep noted.  Past Medical History-  Patient Active Problem List   Diagnosis Date Noted  . Idiopathic urticaria 06/16/2017    Priority: Medium  . Hyperlipidemia 09/05/2006    Priority: Medium  . Osteoporosis 11/15/2014    Priority: Low  . Allergic rhinitis 11/15/2014    Priority: Low  . History of basal cell cancer 11/15/2014    Priority: Low  . Rosacea     Priority: Low  . Osteoarthritis 11/18/2011    Priority: Low  . Hematuria 09/25/2008    Priority: Low  . Bilateral shoulder pain 11/15/2014    Medications- reviewed and updated Current Outpatient Medications  Medication Sig Dispense Refill  . alendronate (FOSAMAX) 70 MG tablet Take 1 tablet (70 mg total) by mouth every 7 (seven) days. Take with a full glass of water on an empty stomach. 4 tablet 11  . calcium carbonate (OS-CAL) 600 MG TABS Take 600 mg by mouth 2 (two) times daily with a meal.      . cholecalciferol (VITAMIN D3) 25 MCG (1000 UT) tablet Take 1,000 Units by mouth daily.    . Eflornithine HCl (VANIQA) 13.9 % cream Apply topically daily. Prescribed by dermatology    . fluticasone (FLONASE) 50 MCG/ACT nasal spray Place 2 sprays into both nostrils daily. (Patient taking differently: Place 2 sprays into both nostrils daily as needed for allergies or rhinitis. ) 16 g 11  . Glucosamine-Chondroit-Vit C-Mn (GLUCOSAMINE CHONDR 1500 COMPLX) CAPS Take 1 capsule by mouth at bedtime.     Marland Kitchen loratadine (CLARITIN) 10 MG tablet Take 10 mg by mouth daily.    . minoxidil (ROGAINE) 2 % external solution Apply 1 application topically at bedtime.     . montelukast (SINGULAIR) 10 MG tablet Take one tablet once daily as directed (Patient taking differently: Take 10 mg by mouth at bedtime. ) 30 tablet 11  . traZODone  (DESYREL) 50 MG tablet Take 0.5-1 tablets (25-50 mg total) by mouth at bedtime as needed for sleep. 30 tablet 1   No current facility-administered medications for this visit.      Objective:  BP 110/62 (BP Location: Left Arm, Patient Position: Sitting, Cuff Size: Normal)   Pulse 76   Temp 97.8 F (36.6 C) (Oral)   Ht 5\' 3"  (1.6 m)   Wt 126 lb 3.2 oz (57.2 kg)   SpO2 97%   BMI 22.36 kg/m  Gen: NAD, resting comfortably    Assessment and Plan   Situational stress S: Patient with a lot of stress recently - daughter with breast cancer with mets to lung  - genetic defect from husband's side in daughter - husband with eye trouble over last year - loss of son in the 2016 to MI at 45  Has not been sleeping well as a result until at least the last 2 nights. Starting to trying to feel like she is doing better.  Since she found out she is also had decreased interest in doing things, some feelings of being down and depressed.  She has had a decrease in her appetite and some trouble concentrating.  PHQ 9 is thus elevated to 12.  No suicidal thoughts  After hearing about result has had some shooting pains in axilla just one time.  A/P: Situational stress related  to daughter with recent cancer diagnosis -Referred to [facility] for counseling - If it takes a long time for her to get in with our behavioral health we discussed I can potentially chat with Trey Paula at our office to see if we can get her in sooner -She may also use some talk therapy with her pastors a church - trial trazodone for sleep if needed  Future Appointments  Date Time Provider Emanuel  01/19/2019  9:00 AM Elouise Munroe, MD [facility]  07/11/2019  8:20 AM Marin Olp, MD [facility]  09/11/2019  4:45 PM Kozlow, Donnamarie Poag, MD [facility] None   Meds ordered this encounter  Medications  . traZODone (DESYREL) 50 MG tablet    Sig: Take 0.5-1 tablets (25-50 mg total) by mouth at  bedtime as needed for sleep.    Dispense:  30 tablet    Refill:  1   Return precautions advised.  Garret Reddish, MD

## 2019-01-12 ENCOUNTER — Telehealth: Payer: Self-pay

## 2019-01-12 DIAGNOSIS — N819 Female genital prolapse, unspecified: Secondary | ICD-10-CM | POA: Insufficient documentation

## 2019-01-12 DIAGNOSIS — R300 Dysuria: Secondary | ICD-10-CM | POA: Insufficient documentation

## 2019-01-12 NOTE — Telephone Encounter (Signed)
I was about to call pt back but pt called me at the same time. Phone note already made by [facility] and handled. No further action needed.

## 2019-01-19 ENCOUNTER — Telehealth: Payer: Self-pay | Admitting: Family Medicine

## 2019-01-19 ENCOUNTER — Ambulatory Visit: Payer: Medicare Other | Admitting: Internal Medicine

## 2019-01-19 NOTE — Telephone Encounter (Signed)
See note

## 2019-01-19 NOTE — Telephone Encounter (Signed)
Copied from [place] [phone]. Topic: Quick Communication - See Telephone Encounter >> Jan 19, 2019  9:55 AM Sheran Luz wrote: CRM for notification. See Telephone encounter for: 01/19/19.   Patient calling as FYI to let Dr. Yong Channel know that she is scheduled to see Lattie Haw with behavioral health on 02/19/2019. She also inquired if Dr. Yong Channel thought she should be seen sooner. Please advise.

## 2019-01-19 NOTE — Telephone Encounter (Signed)
Please advise 

## 2019-01-19 NOTE — Telephone Encounter (Signed)
Lea- I know Lattie Haw has said she will work our patients in sooner- is there a good way for me to reach her? Im usually seeing patients and I don't see her on epic- e-mail? Or can you ask her about this case if you see her early next week for me?

## 2019-01-22 ENCOUNTER — Ambulatory Visit: Payer: Medicare Other | Admitting: Cardiology

## 2019-01-22 ENCOUNTER — Encounter: Payer: Self-pay | Admitting: Cardiology

## 2019-01-22 VITALS — BP 104/70 | HR 82 | Ht 63.0 in | Wt 126.0 lb

## 2019-01-22 DIAGNOSIS — R072 Precordial pain: Secondary | ICD-10-CM

## 2019-01-22 DIAGNOSIS — Z7189 Other specified counseling: Secondary | ICD-10-CM | POA: Diagnosis not present

## 2019-01-22 DIAGNOSIS — Z01812 Encounter for preprocedural laboratory examination: Secondary | ICD-10-CM

## 2019-01-22 MED ORDER — METOPROLOL TARTRATE 50 MG PO TABS: ORAL_TABLET | ORAL | 0 refills | Status: DC

## 2019-01-22 NOTE — Progress Notes (Signed)
Cardiology Office Note:    Date:  01/22/2019   ID:  GENISE STRACK, Alferd Apa 1946/03/07, MRN [id number]  PCP:  Marin Olp, MD  Cardiologist:  Buford Dresser, MD PhD  Referring MD: Marin Olp, MD   Chief Complaint  Patient presents with  . Follow-up  . Chest Pain    Every now and then.    History of Present Illness:    Rhonda [name] is a 73 y.o. female with a hx of allergies who is seen as a new consult at the request of Marin Olp, MD for the evaluation and management of chest pain.  Chest pain: -Initial onset: Around the time of her daughter's diagnosis of breast cancer. New Years Eve until 1/24, was continuous, then slightly better, then recently recurred. -Quality: central chest tightness, 2-3/10, tightness, nonradiating, no shortness of breath, nausea, diaphoresis, lightheadedness -Frequency: was constant, eased off, and now periodically peaks and lasts at least several hours -Duration: continuous -Associated symptoms: none -Aggravating/alleviating factors: better with sleep, though she has had trouble sleeping. Hasn't taken sleeping pill yet, is trying to manage without medication -Prior cardiac history: had similar symptoms when her son died several years ago, then resolved. This started with her daughter's diagnosis of breast cancer -Prior ECG: RBBB -Prior workup: none -Prior treatment: none -Alcohol: 1 glass wine/day -Tobacco: about 8 years total, college and a little after. None in decades (48 years ago) -Comorbidities: no diabetes, hypertension, never been on medications for HLD -Exercise level: has exercised every day since her symptoms started. Tues/Thurs goes to swim class for aqua strengthening. Also walks 35-40 minutes daily with her husband at least 4 days/week. Sometimes does elliptical as well. -Cardiac ROS: no shortness of breath, no PND, no orthopnea, no LE edema, no syncope -Family history: son died of MI. Parents without heart  disease, has 4 siblings who are all healthy.   Past Medical History:  Diagnosis Date  . Allergy   . Arthritis   . Hyperlipidemia   . Neck pain, chronic   . 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 Rosacea    metrogel in past    Past Surgical History:  Procedure Laterality Date  . bcc excision     x3  . CHOLECYSTECTOMY    . COLONOSCOPY WITH PROPOFOL N/A 07/20/2016   Procedure: COLONOSCOPY WITH PROPOFOL;  Surgeon: Garlan Fair, MD;  Location: [facility];  Service: Endoscopy;  Laterality: N/A;  . EYE SURGERY  2011   bilateral cataract with lens implant  . HYSTEROSCOPY    . KNEE SURGERY  2018   arthroscopic for meniscal tear  . mohr's surgery     face  . TONSILLECTOMY    . TUBAL LIGATION      Current Medications: Current Outpatient Medications on File Prior to Visit  Medication Sig  . alendronate (FOSAMAX) 70 MG tablet Take 1 tablet (70 mg total) by mouth every 7 (seven) days. Take with a full glass of water on an empty stomach.  . calcium carbonate (OS-CAL) 600 MG TABS Take 600 mg by mouth 2 (two) times daily with a meal.    . cholecalciferol (VITAMIN D3) 25 MCG (1000 UT) tablet Take 1,000 Units by mouth daily.  . Eflornithine HCl (VANIQA) 13.9 % cream Apply topically daily. Prescribed by dermatology  . fluticasone (FLONASE) 50 MCG/ACT nasal spray Place 2 sprays into both nostrils daily. (Patient taking differently: Place 2 sprays into both nostrils daily as needed  for allergies or rhinitis. )  . Glucosamine-Chondroit-Vit C-Mn (GLUCOSAMINE CHONDR 1500 COMPLX) CAPS Take 1 capsule by mouth at bedtime.   Marland Kitchen loratadine (CLARITIN) 10 MG tablet Take 10 mg by mouth daily.  . minoxidil (ROGAINE) 2 % external solution Apply 1 application topically at bedtime.   . montelukast (SINGULAIR) 10 MG tablet Take one tablet once daily as directed (Patient taking differently: Take 10 mg by mouth at bedtime. )  . traZODone (DESYREL) 50 MG tablet Take 0.5-1  tablets (25-50 mg total) by mouth at bedtime as needed for sleep.   No current facility-administered medications on file prior to visit.      Allergies:   Latex   Social History   Socioeconomic History  . Marital status: Married    Spouse name: Not on file  . Number of children: Not on file  . Years of education: Not on file  . Highest education level: Not on file  Occupational History  . Not on file  Social Needs  . Financial resource strain: Not on file  . Food insecurity:    Worry: Not on file    Inability: Not on file  . Transportation needs:    Medical: Not on file    Non-medical: Not on file  Tobacco Use  . Smoking status: Former Smoker    Packs/day: 0.75    Years: 10.00    Pack years: 7.50    Types: Cigarettes    Last attempt to quit: 1972    Years since quitting: 48.1  . Smokeless tobacco: Never Used  . Tobacco comment: quit at age 38  Substance and Sexual Activity  . Alcohol use: Yes    Alcohol/week: 7.0 standard drinks    Types: 7 Glasses of wine per week    Comment: one glass of wine a day  . Drug use: No  . Sexual activity: Not on file  Lifestyle  . Physical activity:    Days per week: Not on file    Minutes per session: Not on file  . Stress: Not on file  Relationships  . Social connections:    Talks on phone: Not on file    Gets together: Not on file    Attends religious service: Not on file    Active member of club or organization: Not on file    Attends meetings of clubs or organizations: Not on file    Relationship status: Not on file  Other Topics Concern  . Not on file  Social History Narrative   Married 1972. 1 living child daughter [name]. lost son 10 years old to [place] in 2017. No grandkids. 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      Family History: The patient's family history includes Alzheimer's disease in her mother;  Breast cancer in her daughter; CAD in her son; Cancer in her father; Paget's disease of bone in her mother.  ROS:   Please see the history of present illness.  Additional pertinent ROS:  Constitutional: Negative for chills, fever, night sweats, unintentional weight loss  HENT: Negative for ear pain and hearing loss.   Eyes: Negative for loss of vision and eye pain.  Respiratory: Negative for cough, sputum, shortness of breath, wheezing.   Cardiovascular: See HPI. Gastrointestinal: Negative for abdominal pain, melena, and hematochezia.  Genitourinary: Negative for dysuria and hematuria.  Musculoskeletal: Negative for falls and  myalgias.  Skin: Negative for itching and rash.  Neurological: Negative for focal weakness, focal sensory changes and loss of consciousness.  Endo/Heme/Allergies: Does not bruise/bleed easily.    EKGs/Labs/Other Studies Reviewed:    The following studies were reviewed today: Prior notes  EKG:  EKG is personally reviewed.  The ekg ordered today demonstrates NSR at 82 bpm, RBBB, low voltage  Recent Labs: 06/21/2018: ALT 24 12/22/2018: BUN 20; Creatinine, Ser 0.81; Hemoglobin 14.8; Platelets 245; Potassium 3.9; Sodium 141  Recent Lipid Panel    Component Value Date/Time   CHOL 192 06/21/2018 0908   TRIG 127.0 06/21/2018 0908   HDL 59.40 06/21/2018 0908   CHOLHDL 3 06/21/2018 0908   VLDL 25.4 06/21/2018 0908   LDLCALC 107 (H) 06/21/2018 0908   LDLDIRECT 126.4 11/18/2011 0945    Physical Exam:    VS:  BP 104/70 (BP Location: Left Arm, Patient Position: Sitting, Cuff Size: Normal)   Pulse 82   Ht 5\' 3"  (1.6 m)   Wt 126 lb (57.2 kg)   BMI 22.32 kg/m     Wt Readings from Last 3 Encounters:  01/22/19 126 lb (57.2 kg)  01/11/19 126 lb 3.2 oz (57.2 kg)  12/22/18 129 lb (58.5 kg)     GEN: Well nourished, well developed in no acute distress HEENT: Normal NECK: No JVD; No carotid bruits LYMPHATICS: No lymphadenopathy CARDIAC: regular rhythm, normal S1  and S2, no murmurs, rubs, gallops. Radial and DP pulses 2+ bilaterally. RESPIRATORY:  Clear to auscultation without rales, wheezing or rhonchi  ABDOMEN: Soft, non-tender, non-distended MUSCULOSKELETAL:  No edema; No deformity  SKIN: Warm and dry NEUROLOGIC:  Alert and oriented x 3 PSYCHIATRIC:  Normal affect   ASSESSMENT:    1. Precordial pain   2. Pre-procedure lab exam   3. Cardiac risk counseling   4. Counseling on health promotion and disease prevention    PLAN:    Chest discomfort: atypical for cardiac cause, as it is continuous, not associated with exertion (and in fact she exercises through it), no associated symptoms, no high risk. However, it is causing her anxiety in an already stressful time in her life. Discussed options for additional evaluation and testing, as well as pros/cons, to no testing, treadmill test, nuclear test, and CT coronary. She has elected for CT coronary -ordered metoprolol, BMET prior to testing -if normal, would not pursue further cardiovascular testing -if evidence of calcium/plaque but no significant blockage, would start statin +/-aspirin  Primary prevention and risk assessment: -recommend heart healthy/Mediterranean diet, with whole grains, fruits, vegetable, fish, lean meats, nuts, and olive oil. Limit salt. -recommend moderate walking, 3-5 times/week for 30-50 minutes each session. Aim for at least 150 minutes.week. Goal should be pace of 3 miles/hours, or walking 1.5 miles in 30 minutes -recommend avoidance of tobacco products. Avoid excess alcohol. -Additional risk factor control:  -Diabetes: A1c is not available, no history  -Lipids: HDL 59, LDL 107. Given lack of other risk factors and the pattern of the ASCVD risk calculator to overestimate risk in older women, would not start statin unless calcium/plaque seen on CT coronary  -Blood pressure control: no history of hypertension, runs low normal  -Weight: BMI at goal at 22 -ASCVD risk  score: The 10-year ASCVD risk score Mikey Bussing DC Jr., et al., 2013) is: 9.8%   Values used to calculate the score:     Age: 25 years     Sex: Female     Is Non-Hispanic African American: No  Diabetic: No     Tobacco smoker: No     Systolic Blood Pressure: 409 mmHg     Is BP treated: No     HDL Cholesterol: 59.4 mg/dL     Total Cholesterol: 192 mg/dL   Plan for follow up: 3 mos or sooner based on results of testing  Medication Adjustments/Labs and Tests Ordered: Current medicines are reviewed at length with the patient today.  Concerns regarding medicines are outlined above.  Orders Placed This Encounter  Procedures  . CT CORONARY MORPH W/CTA COR W/SCORE W/CA W/CM &/OR WO/CM  . CT CORONARY FRACTIONAL FLOW RESERVE DATA PREP  . CT CORONARY FRACTIONAL FLOW RESERVE FLUID ANALYSIS  . Basic metabolic panel  . EKG 12-Lead   Meds ordered this encounter  Medications  . metoprolol tartrate (LOPRESSOR) 50 MG tablet    Sig: TAKE 1 TABLET 2 HR PRIOR TO CARDIAC PROCEDURE    Dispense:  1 tablet    Refill:  0    Patient Instructions  Medication Instructions:  Your Physician recommend you continue on your current medication as directed.    If you need a refill on your cardiac medications before your next appointment, please call your pharmacy.   Lab work: Your physician recommends that you return for lab work 1 week prior to procedure (BMP).  If you have labs (blood work) drawn today and your tests are completely normal, you will receive your results only by: Marland Kitchen MyChart Message (if you have MyChart) OR . A paper copy in the mail If you have any lab test that is abnormal or we need to change your treatment, we will call you to review the results.  Testing/Procedures: Your physician has requested that you have cardiac CT. Cardiac computed tomography (CT) is a painless test that uses an x-ray machine to take clear, detailed pictures of your heart. For further information please visit  [email or web address]. Please follow instruction sheet as given. [facility]  Follow-Up: At [facility], you and your health needs are our priority.  As part of our continuing mission to provide you with exceptional heart care, we have created designated Provider Care Teams.  These Care Teams include your primary Cardiologist (physician) and Advanced Practice Providers (APPs -  Physician Assistants and Nurse Practitioners) who all work together to provide you with the care you need, when you need it. You will need a follow up appointment in 3 months.  Please call our office 2 months in advance to schedule this appointment.  You may see Dr. Harrell Gave or one of the following Advanced Practice Providers on your designated Care Team:   Rosaria Ferries, PA-C . Jory Sims, DNP, ANP  Please arrive at the [facility] main entrance of [facility] at xx:xx AM (30-45 minutes prior to test start time)  [facility] [place], [place] [address] [phone]  Proceed to the [facility] Radiology Department (First Floor).  Please follow these instructions carefully (unless otherwise directed):   On the Night Before the Test: . Be sure to Drink plenty of water. . Do not consume any caffeinated/decaffeinated beverages or chocolate 12 hours prior to your test. . Do not take any antihistamines 12 hours prior to your test.   On the Day of the Test: . Drink plenty of water. Do not drink any water within one hour of the test. . Do not eat any food 4 hours prior to the test. . You may take your regular medications  prior to the test.  . Take metoprolol (Lopressor) two hours prior to test       After the Test: . Drink plenty of water. . After receiving IV contrast, you may experience a mild flushed feeling. This is normal. . On occasion, you may experience a mild rash up to 24 hours after the test. This is not dangerous. If this occurs, you can take  Benadryl 25 mg and increase your fluid intake. . If you experience trouble breathing, this can be serious. If it is severe call 911 IMMEDIATELY. If it is mild, please call our office.        Signed, Buford Dresser, MD PhD 01/22/2019 3:01 PM    [facility]

## 2019-01-22 NOTE — Patient Instructions (Signed)
Medication Instructions:  Your Physician recommend you continue on your current medication as directed.    If you need a refill on your cardiac medications before your next appointment, please call your pharmacy.   Lab work: Your physician recommends that you return for lab work 1 week prior to procedure (BMP).  If you have labs (blood work) drawn today and your tests are completely normal, you will receive your results only by: Marland Kitchen MyChart Message (if you have MyChart) OR . A paper copy in the mail If you have any lab test that is abnormal or we need to change your treatment, we will call you to review the results.  Testing/Procedures: Your physician has requested that you have cardiac CT. Cardiac computed tomography (CT) is a painless test that uses an x-ray machine to take clear, detailed pictures of your heart. For further information please visit [employer]ugeFiesta[employer]tn. Please follow instruction sheet as given. [facility]  Follow-Up: At [facility], you and your health needs are our priority.  As part of our continuing mission to provide you with exceptional heart care, we have created designated Provider Care Teams.  These Care Teams include your primary Cardiologist (physician) and Advanced Practice Providers (APPs -  Physician Assistants and Nurse Practitioners) who all work together to provide you with the care you need, when you need it. You will need a follow up appointment in 3 months.  Please call our office 2 months in advance to schedule this appointment.  You may see Dr. Harrell Gave or one of the following Advanced Practice Providers on your designated Care Team:   Rosaria Ferries, PA-C . Jory Sims, DNP, ANP  Please arrive at the [facility] main entrance of [facility] at xx:xx AM (30-45 minutes prior to test start time)  [facility] [place], [place] [address] [phone]  Proceed to the [facility] Department  (First Floor).  Please follow these instructions carefully (unless otherwise directed):   On the Night Before the Test: . Be sure to Drink plenty of water. . Do not consume any caffeinated/decaffeinated beverages or chocolate 12 hours prior to your test. . Do not take any antihistamines 12 hours prior to your test.   On the Day of the Test: . Drink plenty of water. Do not drink any water within one hour of the test. . Do not eat any food 4 hours prior to the test. . You may take your regular medications prior to the test.  . Take metoprolol (Lopressor) two hours prior to test       After the Test: . Drink plenty of water. . After receiving IV contrast, you may experience a mild flushed feeling. This is normal. . On occasion, you may experience a mild rash up to 24 hours after the test. This is not dangerous. If this occurs, you can take Benadryl 25 mg and increase your fluid intake. . If you experience trouble breathing, this can be serious. If it is severe call 911 IMMEDIATELY. If it is mild, please call our office.

## 2019-01-22 NOTE — Telephone Encounter (Signed)
Thanks so much Lea!

## 2019-01-22 NOTE — Telephone Encounter (Signed)
I have given this information to [place] and she will work on getting the patient in earlier.

## 2019-01-25 ENCOUNTER — Ambulatory Visit (INDEPENDENT_AMBULATORY_CARE_PROVIDER_SITE_OTHER): Payer: Medicare Other | Admitting: Psychology

## 2019-01-25 ENCOUNTER — Ambulatory Visit: Payer: Medicare Other | Admitting: Psychology

## 2019-01-25 DIAGNOSIS — F320 Major depressive disorder, single episode, mild: Secondary | ICD-10-CM

## 2019-01-29 ENCOUNTER — Other Ambulatory Visit: Payer: Self-pay | Admitting: Family Medicine

## 2019-01-29 DIAGNOSIS — Z1231 Encounter for screening mammogram for malignant neoplasm of breast: Secondary | ICD-10-CM

## 2019-02-03 ENCOUNTER — Other Ambulatory Visit: Payer: Self-pay | Admitting: Family Medicine

## 2019-02-08 LAB — BASIC METABOLIC PANEL
BUN / CREAT RATIO: 27 (ref 12–28)
BUN: 20 mg/dL (ref 8–27)
CO2: 23 mmol/L (ref 20–29)
Calcium: 9.3 mg/dL (ref 8.7–10.3)
Chloride: 100 mmol/L (ref 96–106)
Creatinine, Ser: 0.74 mg/dL (ref 0.57–1.00)
GFR calc Af Amer: 92 mL/min/{1.73_m2} (ref >59)
GFR calc non Af Amer: 80 mL/min/{1.73_m2} (ref >59)
Glucose: 74 mg/dL (ref 65–99)
Potassium: 4.5 mmol/L (ref 3.5–5.2)
Sodium: 140 mmol/L (ref 134–144)

## 2019-02-09 ENCOUNTER — Encounter: Payer: Self-pay | Admitting: Family Medicine

## 2019-02-13 ENCOUNTER — Telehealth: Payer: Self-pay | Admitting: Cardiology

## 2019-02-13 ENCOUNTER — Telehealth (HOSPITAL_COMMUNITY): Payer: Self-pay | Admitting: Emergency Medicine

## 2019-02-13 NOTE — Telephone Encounter (Signed)
Spoke with pt who states she has already spoken with someone from the CT, and she has decided to proceed with testing.

## 2019-02-13 NOTE — Telephone Encounter (Signed)
Reaching out to patient to offer assistance regarding upcoming cardiac imaging study; pt verbalizes understanding of appt date/time, parking situation and where to check in, pre-test NPO status and medications ordered, and verified current allergies; name and call back number provided for further questions should they arise Marchia Bond RN Navigator Cardiac Imaging [place] [facility] [phone] office [phone] cell  Pt denies symptoms of fever, cough, or sob at this time. Also denies travel to [place] infected areas or known contacts who have traveled to [place] infected areas

## 2019-02-13 NOTE — Telephone Encounter (Signed)
Patient is calling wanting to know if she should still keep her CT appt that is scheduled to tomorrow.

## 2019-02-15 ENCOUNTER — Ambulatory Visit (HOSPITAL_COMMUNITY)
Admission: RE | Admit: 2019-02-15 | Discharge: 2019-02-15 | Disposition: A | Discharge status: Home / Self Care | Payer: Medicare Other | Source: Ambulatory Visit | Attending: Cardiology | Admitting: Cardiology

## 2019-02-15 ENCOUNTER — Other Ambulatory Visit: Payer: Self-pay

## 2019-02-15 DIAGNOSIS — R072 Precordial pain: Secondary | ICD-10-CM

## 2019-02-15 MED ORDER — IOHEXOL 350 MG/ML SOLN
80.0000 mL | Freq: Once | INTRAVENOUS | Status: AC | PRN
  Administered 2019-02-15: 80 mL via INTRAVENOUS

## 2019-02-15 MED ORDER — NITROGLYCERIN 0.4 MG SL SUBL
0.8000 mg | SUBLINGUAL_TABLET | Freq: Once | SUBLINGUAL | Status: AC
  Administered 2019-02-15: 0.8 mg via SUBLINGUAL
  Filled 2019-02-15: qty 25

## 2019-02-15 MED ORDER — NITROGLYCERIN 0.4 MG SL SUBL
SUBLINGUAL_TABLET | SUBLINGUAL | Status: AC
  Filled 2019-02-15: qty 2

## 2019-02-19 ENCOUNTER — Ambulatory Visit (INDEPENDENT_AMBULATORY_CARE_PROVIDER_SITE_OTHER): Payer: Medicare Other | Admitting: Psychology

## 2019-02-19 ENCOUNTER — Ambulatory Visit: Payer: Medicare Other | Admitting: Psychology

## 2019-02-19 DIAGNOSIS — F4323 Adjustment disorder with mixed anxiety and depressed mood: Secondary | ICD-10-CM | POA: Diagnosis not present

## 2019-03-05 ENCOUNTER — Ambulatory Visit: Payer: Medicare Other | Admitting: Psychology

## 2019-03-12 ENCOUNTER — Ambulatory Visit: Payer: Self-pay

## 2019-03-19 ENCOUNTER — Ambulatory Visit (INDEPENDENT_AMBULATORY_CARE_PROVIDER_SITE_OTHER): Payer: Medicare Other | Admitting: Psychology

## 2019-03-19 DIAGNOSIS — F4323 Adjustment disorder with mixed anxiety and depressed mood: Secondary | ICD-10-CM

## 2019-04-04 ENCOUNTER — Ambulatory Visit: Payer: Medicare Other | Admitting: Psychology

## 2019-04-13 ENCOUNTER — Other Ambulatory Visit: Payer: Self-pay | Admitting: Family Medicine

## 2019-04-16 ENCOUNTER — Ambulatory Visit (INDEPENDENT_AMBULATORY_CARE_PROVIDER_SITE_OTHER): Payer: Medicare Other | Admitting: Psychology

## 2019-04-16 DIAGNOSIS — F4323 Adjustment disorder with mixed anxiety and depressed mood: Secondary | ICD-10-CM

## 2019-04-25 ENCOUNTER — Other Ambulatory Visit: Payer: Self-pay

## 2019-04-25 ENCOUNTER — Ambulatory Visit
Admission: RE | Admit: 2019-04-25 | Discharge: 2019-04-25 | Disposition: A | Discharge status: Home / Self Care | Payer: Medicare Other | Source: Ambulatory Visit | Attending: Family Medicine | Admitting: Family Medicine

## 2019-04-25 DIAGNOSIS — Z1231 Encounter for screening mammogram for malignant neoplasm of breast: Secondary | ICD-10-CM

## 2019-05-01 ENCOUNTER — Telehealth: Payer: Self-pay | Admitting: Cardiology

## 2019-05-01 NOTE — Telephone Encounter (Signed)
smartphone/ consent/ my chart active/ pre reg completed °

## 2019-05-03 ENCOUNTER — Encounter: Payer: Self-pay | Admitting: Cardiology

## 2019-05-03 ENCOUNTER — Telehealth (INDEPENDENT_AMBULATORY_CARE_PROVIDER_SITE_OTHER): Payer: Medicare Other | Admitting: Cardiology

## 2019-05-03 VITALS — Ht 63.0 in | Wt 119.0 lb

## 2019-05-03 DIAGNOSIS — Z7189 Other specified counseling: Secondary | ICD-10-CM | POA: Diagnosis not present

## 2019-05-03 DIAGNOSIS — Z712 Person consulting for explanation of examination or test findings: Secondary | ICD-10-CM | POA: Diagnosis not present

## 2019-05-03 NOTE — Patient Instructions (Signed)
Medication Instructions:  Your Physician recommend you continue on your current medication as directed.    If you need a refill on your cardiac medications before your next appointment, please call your pharmacy.   Lab work: None  Testing/Procedures: None  Follow-Up: Your physician recommends that you schedule a follow-up appointment as needed with Dr. Christopher.     

## 2019-05-03 NOTE — Progress Notes (Signed)
Virtual Visit via Video Note   This visit type was conducted due to national recommendations for restrictions regarding the COVID-19 Pandemic (e.g. social distancing) in an effort to limit this patient's exposure and mitigate transmission in our community.  Due to her co-morbid illnesses, this patient is at least at moderate risk for complications without adequate follow up.  This format is felt to be most appropriate for this patient at this time.  All issues noted in this document were discussed and addressed.  A limited physical exam was performed with this format.  Please refer to the patient's chart for her consent to telehealth for [facility].   Date:  05/03/2019   ID:  Rhonda [name], DOB September 25, 1946, MRN [id number]  Patient Location: Home Provider Location: Home  PCP:  Marin Olp, MD  Cardiologist:  Buford Dresser, MD  Electrophysiologist:  None   Evaluation Performed:  Follow-Up Visit  Chief Complaint:  Follow up  History of Present Illness:    Rhonda [name] is a 73 y.o. female with prior history of chest pain, seen for follow up today virtually to review symptoms, test results, and risk counseling.  The patient does not have symptoms concerning for COVID-19 infection (fever, chills, cough, or new shortness of breath).   Overall she is doing well. She is tightly isolating during coronavirus so that she can visit her daughter. She has had no more chest pain since her initial episodes in January. She walks about 3 miles/day and has also been vigorously cleaning her house without issues. We reviewed results of her CT coronary, which showed no plaque and no calcium.  Denies chest pain, shortness of breath at rest or with normal exertion. No PND, orthopnea, LE edema or unexpected weight gain. No syncope or palpitations.   Past Medical History:  Diagnosis Date  . Allergy   . Arthritis   . Hyperlipidemia   . Neck pain, chronic   . RBBB (right bundle branch  block with left anterior fascicular block)   . Rosacea    metrogel in past   Past Surgical History:  Procedure Laterality Date  . bcc excision     x3  . CHOLECYSTECTOMY    . COLONOSCOPY WITH PROPOFOL N/A 07/20/2016   Procedure: COLONOSCOPY WITH PROPOFOL;  Surgeon: Garlan Fair, MD;  Location: [facility];  Service: Endoscopy;  Laterality: N/A;  . EYE SURGERY  2011   bilateral cataract with lens implant  . HYSTEROSCOPY    . KNEE SURGERY  2018   arthroscopic for meniscal tear  . mohr's surgery     face  . TONSILLECTOMY    . TUBAL LIGATION       Current Meds  Medication Sig  . alendronate (FOSAMAX) 70 MG tablet TAKE 1 TABLET BY MOUTH EVERY 7 (SEVEN) DAYS. TAKE WITH A FULL GLASS OF WATER ON AN EMPTY STOMACH.  . calcium carbonate (OS-CAL) 600 MG TABS Take 600 mg by mouth 2 (two) times daily with a meal.    . cholecalciferol (VITAMIN D3) 25 MCG (1000 UT) tablet Take 1,000 Units by mouth daily.  . Eflornithine HCl (VANIQA) 13.9 % cream Apply topically daily. Prescribed by dermatology  . fluticasone (FLONASE) 50 MCG/ACT nasal spray Place 2 sprays into both nostrils as needed for allergies or rhinitis.  . Glucosamine-Chondroit-Vit C-Mn (GLUCOSAMINE CHONDR 1500 COMPLX) CAPS Take 1 capsule by mouth at bedtime.   Marland Kitchen loratadine (CLARITIN) 10 MG tablet Take 10 mg by mouth daily.  . minoxidil (ROGAINE)  2 % external solution Apply 1 application topically at bedtime.   . montelukast (SINGULAIR) 10 MG tablet Take one tablet once daily as directed     Allergies:   Latex   Social History   Tobacco Use  . Smoking status: Former Smoker    Packs/day: 0.75    Years: 10.00    Pack years: 7.50    Types: Cigarettes    Last attempt to quit: 1972    Years since quitting: 48.4  . Smokeless tobacco: Never Used  . Tobacco comment: quit at age 66  Substance Use Topics  . Alcohol use: Yes    Alcohol/week: 7.0 standard drinks    Types: 7 Glasses of wine per week    Comment: one glass of wine a  day  . Drug use: No     Family Hx: The patient's family history includes Alzheimer's disease in her mother; Breast cancer (age of onset: 2) in her daughter; CAD in her son; Cancer in her father; Paget's disease of bone in her mother.  ROS:   Please see the history of present illness.    Constitutional: Negative for chills, fever, night sweats, unintentional weight loss  HENT: Negative for ear pain and hearing loss.   Eyes: Negative for loss of vision and eye pain.  Respiratory: Negative for cough, sputum, wheezing.   Cardiovascular: See HPI. Gastrointestinal: Negative for abdominal pain, melena, and hematochezia.  Genitourinary: Negative for dysuria and hematuria.  Musculoskeletal: Negative for falls and myalgias.  Skin: Negative for itching and rash.  Neurological: Negative for focal weakness, focal sensory changes and loss of consciousness.  Endo/Heme/Allergies: Does not bruise/bleed easily.  All other systems reviewed and are negative.   Prior CV studies:   The following studies were reviewed today: CT coronary 02/15/19 IMPRESSION: 1. Calcium score 0 2.  Normal right dominant coronary arteries 3.  Normal aortic root 2.9 cm  Labs/Other Tests and Data Reviewed:    EKG:  An ECG dated 01/22/19 was personally reviewed today and demonstrated:  NSR, RBBB, low voltage  Recent Labs: 06/21/2018: ALT 24 12/22/2018: Hemoglobin 14.8; Platelets 245 02/08/2019: BUN 20; Creatinine, Ser 0.74; Potassium 4.5; Sodium 140   Recent Lipid Panel Lab Results  Component Value Date/Time   CHOL 192 06/21/2018 09:08 AM   TRIG 127.0 06/21/2018 09:08 AM   HDL 59.40 06/21/2018 09:08 AM   CHOLHDL 3 06/21/2018 09:08 AM   LDLCALC 107 (H) 06/21/2018 09:08 AM   LDLDIRECT 126.4 11/18/2011 09:45 AM    Wt Readings from Last 3 Encounters:  05/03/19 119 lb (54 kg)  01/22/19 126 lb (57.2 kg)  01/11/19 126 lb 3.2 oz (57.2 kg)     Objective:    Vital Signs:  Ht 5\' 3"  (1.6 m)   Wt 119 lb (54 kg)    BMI 21.08 kg/m    VITAL SIGNS:  reviewed GEN:  no acute distress EYES:  sclerae anicteric, EOMI - Extraocular Movements Intact RESPIRATORY:  normal respiratory effort, symmetric expansion CARDIOVASCULAR:  no peripheral edema SKIN:  no rash, lesions or ulcers. MUSCULOSKELETAL:  no obvious deformities. NEURO:  alert and oriented x 3, no obvious focal deficit PSYCH:  normal affect  ASSESSMENT & PLAN:    Prior chest pain, without recurrence: reviewed results of CT again. No symptoms, no evidence of CAD on scan. -chest pain event unlikely to be cardiac in nature based on results of study -excellent prognosis given her age and calcium score, discussed that this is an excellent CV risk  marker for low risk of future MI. -counseled on diet/activity, continued excellent primary prevention  COVID-19 Education: The signs and symptoms of COVID-19 were discussed with the patient and how to seek care for testing (follow up with PCP or arrange E-visit).  The importance of social distancing was discussed today.  Time:   Today, I have spent 11 minutes with the patient with telehealth technology discussing the above problems.     Medication Adjustments/Labs and Tests Ordered: Current medicines are reviewed at length with the patient today.  Concerns regarding medicines are outlined above.   Patient Instructions  Medication Instructions:  Your Physician recommend you continue on your current medication as directed.    If you need a refill on your cardiac medications before your next appointment, please call your pharmacy.   Lab work: None  Testing/Procedures: None  Follow-Up: Your physician recommends that you schedule a follow-up appointment as needed with Dr. Harrell Gave.      Tests Ordered: No orders of the defined types were placed in this encounter.   Medication Changes: No orders of the defined types were placed in this encounter.   Disposition:  Follow up as needed. She  has a very good risk profile based on her CT, but I would be more than happy to see her again in the future if she has any concerns.  Signed, Buford Dresser, MD  05/03/2019 12:15 PM    [place]

## 2019-05-14 ENCOUNTER — Ambulatory Visit (INDEPENDENT_AMBULATORY_CARE_PROVIDER_SITE_OTHER): Payer: Medicare Other | Admitting: Psychology

## 2019-05-14 DIAGNOSIS — F4323 Adjustment disorder with mixed anxiety and depressed mood: Secondary | ICD-10-CM

## 2019-06-25 ENCOUNTER — Ambulatory Visit (INDEPENDENT_AMBULATORY_CARE_PROVIDER_SITE_OTHER): Payer: Medicare Other | Admitting: Psychology

## 2019-06-25 ENCOUNTER — Encounter: Payer: Medicare Other | Admitting: Family Medicine

## 2019-06-25 DIAGNOSIS — F4323 Adjustment disorder with mixed anxiety and depressed mood: Secondary | ICD-10-CM

## 2019-07-10 NOTE — Patient Instructions (Addendum)
Health Maintenance Due  Topic Date Due  . INFLUENZA VACCINE We should have flu shots available by September. Please strongly consider getting flu shot this year. If you get your flu shot at a pharmacy- please let us know.  06/30/2019   Schedule your bone density test at check out desk. You may also call directly to X-ray at [phone] to schedule an appointment that is convenient for you.  - located [address] across the street from [place] - in the basement - you do need an appointment for the bone density tests.   Please stop by lab before you go If you do not have mychart- we will call you about results within 5 business days of [place] receiving them.  If you have mychart- we will send your results within 3 business days of [place] receiving them.  If abnormal or we want to clarify a result, we will call or mychart you to make sure you receive the message.  If you have questions or concerns or don't hear within 5-7 days, please send [place] a message or call us.   Bursitis flare up in hips- may try aleve twice a day for 5 days- cut walking back to perhaps a mile a day for 2 weeks and hopefully improving. We do have a good sports medicine doctor- Dr. Tamala Julian if not improving

## 2019-07-10 NOTE — Progress Notes (Addendum)
Phone: [phone]   Subjective:  Patient presents today for their annual physical. Chief complaint-noted.   See problem oriented charting- ROS- full  review of systems was completed and negative except for: hearing loss, post nasal drip, runny nose, sinus pressure, seasonal allergies, eye discharge, neck stineffness  The following were reviewed and entered/updated in epic: Past Medical History:  Diagnosis Date  . Allergy   . Arthritis   . Hyperlipidemia   . Neck pain, chronic   . 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 Rosacea    metrogel in past   Patient Active Problem List   Diagnosis Date Noted  . Idiopathic urticaria 06/16/2017    Priority: Medium  . Hyperlipidemia 09/05/2006    Priority: Medium  . Rash 07/11/2019    Priority: Low  . Osteoporosis 11/15/2014    Priority: Low  . Allergic rhinitis 11/15/2014    Priority: Low  . History of basal cell cancer 11/15/2014    Priority: Low  . Rosacea     Priority: Low  . Osteoarthritis 11/18/2011    Priority: Low  . Hematuria 09/25/2008    Priority: Low  . Dysuria 01/12/2019  . Prolapse of female genital organs 01/12/2019  . Ocular migraine 02/16/2018  . Bilateral shoulder pain 11/15/2014  . Posterior capsular opacification, left 10/18/2013  . Viral conjunctivitis 09/06/2012  . Pseudophakia 08/25/2012  . Choroidal nevus of right eye 10/15/2011  . Posterior vitreous detachment of both eyes 10/15/2011  . Status post intraocular lens implant 10/15/2011   Past Surgical History:  Procedure Laterality Date  . bcc excision     x3  . CHOLECYSTECTOMY    . COLONOSCOPY WITH PROPOFOL N/A 07/20/2016   Procedure: COLONOSCOPY WITH PROPOFOL;  Surgeon: Garlan Fair, MD;  Location: [facility];  Service: Endoscopy;  Laterality: N/A;  . EYE SURGERY  2011   bilateral cataract with lens implant  . HYSTEROSCOPY    . KNEE SURGERY  2018   arthroscopic for meniscal tear  . mohr's surgery     face  . TONSILLECTOMY    . TUBAL LIGATION      Family History  Problem Relation Age of Onset  . Alzheimer's disease Mother        died of bowel rupture  . Paget's disease of bone Mother   . Cancer Father        unknown  . CAD Son        63- died of MI  . Breast cancer Daughter 76    Medications- reviewed and updated Current Outpatient Medications  Medication Sig Dispense Refill  . alendronate (FOSAMAX) 70 MG tablet TAKE 1 TABLET BY MOUTH EVERY 7 (SEVEN) DAYS. TAKE WITH A FULL GLASS OF WATER ON AN EMPTY STOMACH. 12 tablet 3  . calcium carbonate (OS-CAL) 600 MG TABS Take 600 mg by mouth 2 (two) times daily with a meal.      . cholecalciferol (VITAMIN D3) 25 MCG (1000 UT) tablet Take 1,000 Units by mouth daily.    . Eflornithine HCl (VANIQA) 13.9 % cream Apply topically daily. Prescribed by dermatology    . fluocinonide (LIDEX) 0.05 % external solution Apply 1 application topically 2 (two) times daily. Scalp rash 60 mL 1  . fluticasone (FLONASE) 50 MCG/ACT nasal spray Place 2 sprays into both nostrils as needed for allergies or rhinitis.    . Glucosamine-Chondroit-Vit C-Mn (GLUCOSAMINE CHONDR 1500 COMPLX) CAPS Take 1 capsule by mouth at bedtime.     Marland Kitchen  loratadine (CLARITIN) 10 MG tablet Take 10 mg by mouth daily.    . minoxidil (ROGAINE) 2 % external solution Apply 1 application topically at bedtime.     . montelukast (SINGULAIR) 10 MG tablet Take one tablet once daily as directed 30 tablet 11   No current facility-administered medications for this visit.     Allergies-reviewed and updated Allergies  Allergen Reactions  . Latex Rash    Social History   Social History Narrative   Married 1972. 1 living child daughter [name]. lost son 75 years old to [place] in 2017. No grandkids. 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    Objective  Objective:  BP 102/70 (BP Location:  Left Arm, Patient Position: Sitting, Cuff Size: Normal)   Pulse 62   Temp 97.8 F (36.6 C) (Oral)   Ht 5' 2.5" (1.588 m)   Wt 119 lb 12.8 oz (54.3 kg)   SpO2 97%   BMI 21.56 kg/m  Gen: NAD, resting comfortably HEENT: Mucous membranes are moist. Oropharynx normal Neck: no thyromegaly CV: RRR no murmurs rubs or gallops Lungs: CTAB no crackles, wheeze, rhonchi Abdomen: soft/nontender/nondistended/normal bowel sounds. No rebound or guarding.  Ext: no edema Skin: warm, dry Neuro: grossly normal, moves all extremities, PERRLA   Assessment and Plan   73 y.o. female presenting for annual physical.  Health Maintenance counseling: 1. Anticipatory guidance: Patient counseled regarding regular dental exams -q6 months, eye exams - yearly,  avoiding smoking and second hand smoke , limiting alcohol to 1 beverage per day- 1 wine per day .   2. Risk factor reduction:  Advised patient of need for regular exercise and diet rich and fruits and vegetables to reduce risk of heart attack and stroke. Exercise- walking 3 miles a day (bursitis in both hips acting up though). Diet- reasonably healthy.  Wt Readings from Last 3 Encounters:  07/11/19 119 lb 12.8 oz (54.3 kg)  05/03/19 119 lb (54 kg)  01/22/19 126 lb (57.2 kg)  3. Immunizations/screenings/ancillary studies-recommended fall flu shot  Immunization History  Administered Date(s) Administered  . Influenza Split 10/04/2011, 09/12/2012  . Influenza Whole 10/22/2009, 09/03/2010  . Influenza, High Dose Seasonal PF 09/05/2013, 08/29/2014, 09/12/2015, 09/06/2016, 09/14/2017, 09/06/2018  . Influenza,inj,Quad PF,6+ Mos 09/06/2014  . Pneumococcal Conjugate-13 09/06/2014  . Pneumococcal Polysaccharide-23 10/22/2009  . Td 09/25/2008  . Tdap 01/12/2019  . Zoster Recombinat (Shingrix) 03/02/2018, 05/15/2018   4. Cervical cancer screening- still follows with GYN but has past formal age based screening recommendations 5. Breast cancer screening-  breast  exam-with GYN and mammogram 04/05/2019 6. Colon cancer screening -07/20/2016 with 5-year repeat planned due to adenoma with [facility] Dr. Hassell Done  7. Skin cancer screening-follows with Dr. [place] of [facility] dermatology with history of basal cell skin cancer. advised regular sunscreen use. Denies worrisome, changing, or new skin lesions.  8. Birth control/STD check-monogamous/postmenopausal  9. Osteoporosis screening at 65-does have osteoporosis-see below -Former smoker-quit smoking in the 1970s  Status of chronic or acute concerns  Patient's daughter was diagnosed with breast cancer with mets to the lung-we had a visit about this back in February and patient was dealing with significant/appropriate stress.  We referred to [facility] at that time.  We also sent in trazodone to help with sleep.  Patient was also considering talk therapy at her church. - Today patient reports has another scan in September-  lung abnormalities not visble on last scan   Osteoporosis - Taking Fosamax.  Started on this due to elevated FRAX score.  She agrees to updated bone density at this time.  Continue calcium and vitamin D as well  Allergies - Taking Claritin and Singulair. Also using Flonase.   Mild hearing loss ok with hearing aids  Mild hyperlipidemia in the past-last LDL at 107.  Update lipid panel today and calculate 10-year ASCVD risk.  Using last year's number-10-year risk was at 10.6%-with that being said had coronary CT 01/2019 with calcium score of 0 so we opted to remain off statin unless significant increase in lipids with labs today   Idiopathic urticaria-doing well on Singulair and Claritin once a day-seasonality still   Tick bite in July- no systemic symptoms  Bursitis flare up in hips- may try aleve twice a day for 5 days- cut walking back to perhaps a mile a day for 2 weeks and hopefully improving  Neck stiffness- was better with water exercises  Recommended follow up: 1 year physical   Future Appointments  Date Time Provider [place]  09/11/2019  4:30 PM Kozlow, Donnamarie Poag, MD [facility] None    Lab/Order associations:[place]   ICD-10-CM   1. Preventative health care  Z00.00 CBC    Comprehensive metabolic panel    Lipid panel    Urine Microscopic  2. Hyperlipidemia, unspecified hyperlipidemia type  E78.5 CBC    Comprehensive metabolic panel    Lipid panel  3. Age-related osteoporosis without current pathological fracture  M81.0 DG Bone Density  4. History of hematuria  Z87.448 Urine Microscopic  5. Rash  R21     Meds ordered this encounter  Medications  . fluocinonide (LIDEX) 0.05 % external solution    Sig: Apply 1 application topically 2 (two) times daily. Scalp rash    Dispense:  60 mL    Refill:  1    Return precautions advised.  Garret Reddish, MD

## 2019-07-11 ENCOUNTER — Other Ambulatory Visit: Payer: Self-pay

## 2019-07-11 ENCOUNTER — Ambulatory Visit (INDEPENDENT_AMBULATORY_CARE_PROVIDER_SITE_OTHER): Payer: Medicare Other | Admitting: Family Medicine

## 2019-07-11 ENCOUNTER — Encounter: Payer: Self-pay | Admitting: Family Medicine

## 2019-07-11 VITALS — BP 102/70 | HR 62 | Temp 97.8°F | Ht 62.5 in | Wt 119.8 lb

## 2019-07-11 DIAGNOSIS — Z0000 Encounter for general adult medical examination without abnormal findings: Secondary | ICD-10-CM

## 2019-07-11 DIAGNOSIS — M810 Age-related osteoporosis without current pathological fracture: Secondary | ICD-10-CM | POA: Diagnosis not present

## 2019-07-11 DIAGNOSIS — R21 Rash and other nonspecific skin eruption: Secondary | ICD-10-CM

## 2019-07-11 DIAGNOSIS — E785 Hyperlipidemia, unspecified: Secondary | ICD-10-CM

## 2019-07-11 DIAGNOSIS — Z87448 Personal history of other diseases of urinary system: Secondary | ICD-10-CM | POA: Diagnosis not present

## 2019-07-11 LAB — CBC
HCT: 43.2 % (ref 36.0–46.0)
Hemoglobin: 14.6 g/dL (ref 12.0–15.0)
MCHC: 33.9 g/dL (ref 30.0–36.0)
MCV: 91.9 fl (ref 78.0–100.0)
Platelets: 202 10*3/uL (ref 150.0–400.0)
RBC: 4.7 Mil/uL (ref 3.87–5.11)
RDW: 14.1 % (ref 11.5–15.5)
WBC: 6.2 10*3/uL (ref 4.0–10.5)

## 2019-07-11 LAB — LIPID PANEL
Cholesterol: 178 mg/dL (ref 0–200)
HDL: 53.5 mg/dL (ref >39.00)
LDL Cholesterol: 102 mg/dL — ABNORMAL HIGH (ref 0–99)
NonHDL: 124.71
Total CHOL/HDL Ratio: 3
Triglycerides: 112 mg/dL (ref 0.0–149.0)
VLDL: 22.4 mg/dL (ref 0.0–40.0)

## 2019-07-11 LAB — URINALYSIS, MICROSCOPIC ONLY

## 2019-07-11 LAB — COMPREHENSIVE METABOLIC PANEL
ALT: 15 U/L (ref 0–35)
AST: 18 U/L (ref 0–37)
Albumin: 4.6 g/dL (ref 3.5–5.2)
Alkaline Phosphatase: 63 U/L (ref 39–117)
BUN: 23 mg/dL (ref 6–23)
CO2: 25 mEq/L (ref 19–32)
Calcium: 9.4 mg/dL (ref 8.4–10.5)
Chloride: 104 mEq/L (ref 96–112)
Creatinine, Ser: 0.79 mg/dL (ref 0.40–1.20)
GFR: 70.92 mL/min (ref >60.00)
Glucose, Bld: 84 mg/dL (ref 70–99)
Potassium: 4.1 mEq/L (ref 3.5–5.1)
Sodium: 140 mEq/L (ref 135–145)
Total Bilirubin: 0.6 mg/dL (ref 0.2–1.2)
Total Protein: 7.1 g/dL (ref 6.0–8.3)

## 2019-07-11 MED ORDER — FLUOCINONIDE 0.05 % EX SOLN: 1.0000 "application " | Freq: Two times a day (BID) | CUTANEOUS | 1 refills | Status: DC

## 2019-07-12 ENCOUNTER — Ambulatory Visit (INDEPENDENT_AMBULATORY_CARE_PROVIDER_SITE_OTHER)
Admission: RE | Admit: 2019-07-12 | Discharge: 2019-07-12 | Disposition: A | Discharge status: Home / Self Care | Payer: Medicare Other | Source: Ambulatory Visit | Attending: Family Medicine | Admitting: Family Medicine

## 2019-07-12 DIAGNOSIS — M810 Age-related osteoporosis without current pathological fracture: Secondary | ICD-10-CM

## 2019-08-09 ENCOUNTER — Encounter: Payer: Self-pay | Admitting: Family Medicine

## 2019-08-13 ENCOUNTER — Ambulatory Visit: Payer: Medicare Other | Admitting: Psychology

## 2019-09-04 ENCOUNTER — Other Ambulatory Visit: Payer: Self-pay

## 2019-09-04 ENCOUNTER — Ambulatory Visit: Payer: Medicare Other | Admitting: Family Medicine

## 2019-09-04 ENCOUNTER — Ambulatory Visit: Payer: Self-pay | Admitting: *Deleted

## 2019-09-04 ENCOUNTER — Encounter: Payer: Self-pay | Admitting: Family Medicine

## 2019-09-04 VITALS — BP 120/74 | HR 70 | Temp 98.2°F | Ht 62.5 in | Wt 119.0 lb

## 2019-09-04 DIAGNOSIS — M26622 Arthralgia of left temporomandibular joint: Secondary | ICD-10-CM | POA: Diagnosis not present

## 2019-09-04 DIAGNOSIS — H9202 Otalgia, left ear: Secondary | ICD-10-CM | POA: Diagnosis not present

## 2019-09-04 MED ORDER — MELOXICAM 7.5 MG PO TABS: 7.5000 mg | ORAL_TABLET | Freq: Every day | ORAL | 0 refills | Status: DC | PRN

## 2019-09-04 NOTE — Telephone Encounter (Signed)
See note

## 2019-09-04 NOTE — Telephone Encounter (Signed)
Pt coming in at 11:40 today to see Dr. Yong Channel.

## 2019-09-04 NOTE — Telephone Encounter (Signed)
Patient called to say that she have been having shooting pain on the left side of her face asking for a call back to discuss this because she is concerned and does not know what is The cause of this pain. Ph# [phone]   Call to patient- patient denies any cardiac symptom or history of cardiac risk, patient states she is not sure if ear pain or jaw pain- she does have history of TMJ in the past. Patient states she has short brust of pain lasting seconds- started yesterday at noon and has continued.  Reason for Disposition . [1] SEVERE pain (e.g., excruciating) AND [2] not improved after 2 hours of pain medicine  Answer Assessment - Initial Assessment Questions 1. ONSET: "When did the pain start?" (e.g., minutes, hours, days)     Pain started noon yesterday 2. ONSET: "Does the pain come and go, or has it been constant since it started?" (e.g., constant, intermittent, fleeting)     constant 3. SEVERITY: "How bad is the pain?"   (Scale 1-10; mild, moderate or severe)   - MILD (1-3): doesn't interfere with normal activities    - MODERATE (4-7): interferes with normal activities or awakens from sleep    - SEVERE (8-10): excruciating pain, unable to do any normal activities      7-8- lasting few seconds 4. LOCATION: "Where does it hurt?"      L side of face at jaw and ear 5. RASH: "Is there any redness, rash, or swelling of the face?"     no 6. FEVER: "Do you have a fever?" If so, ask: "What is it, how was it measured, and when did it start?"      no 7. OTHER SYMPTOMS: "Do you have any other symptoms?" (e.g., fever, toothache, nasal discharge, nasal congestion, clicking sensation in jaw joint)     Runny nose from allergy 8. PREGNANCY: "Is there any chance you are pregnant?" "When was your last menstrual period?"     n/a  Protocols used: FACE PAIN-A-AH

## 2019-09-04 NOTE — Progress Notes (Addendum)
Phone [phone]   Subjective:  Rhonda [name] is a 73 y.o. year old very pleasant female patient who presents for/with See problem oriented charting Chief Complaint  Patient presents with  . Follow-up  . jaw and ear pain   ROS-denies fever chills.  Denies mouth pain in area of sore in mouth.  Denies hearing loss increased or tenderness  Past Medical History-  Patient Active Problem List   Diagnosis Date Noted  . Idiopathic urticaria 06/16/2017    Priority: Medium  . Hyperlipidemia 09/05/2006    Priority: Medium  . Rash 07/11/2019    Priority: Low  . Osteoporosis 11/15/2014    Priority: Low  . Allergic rhinitis 11/15/2014    Priority: Low  . History of basal cell cancer 11/15/2014    Priority: Low  . Rosacea     Priority: Low  . Osteoarthritis 11/18/2011    Priority: Low  . Hematuria 09/25/2008    Priority: Low  . Dysuria 01/12/2019  . Prolapse of female genital organs 01/12/2019  . Ocular migraine 02/16/2018  . Bilateral shoulder pain 11/15/2014  . Posterior capsular opacification, left 10/18/2013  . Viral conjunctivitis 09/06/2012  . Pseudophakia 08/25/2012  . Choroidal nevus of right eye 10/15/2011  . Posterior vitreous detachment of both eyes 10/15/2011  . Status post intraocular lens implant 10/15/2011    Medications- reviewed and updated Current Outpatient Medications  Medication Sig Dispense Refill  . alendronate (FOSAMAX) 70 MG tablet TAKE 1 TABLET BY MOUTH EVERY 7 (SEVEN) DAYS. TAKE WITH A FULL GLASS OF WATER ON AN EMPTY STOMACH. 12 tablet 3  . calcium carbonate (OS-CAL) 600 MG TABS Take 600 mg by mouth 2 (two) times daily with a meal.      . cholecalciferol (VITAMIN D3) 25 MCG (1000 UT) tablet Take 1,000 Units by mouth daily.    . Eflornithine HCl (VANIQA) 13.9 % cream Apply topically daily. Prescribed by dermatology    . fluocinonide (LIDEX) 0.05 % external solution Apply 1 application topically 2 (two) times daily. Scalp rash 60 mL 1  . fluticasone  (FLONASE) 50 MCG/ACT nasal spray Place 2 sprays into both nostrils as needed for allergies or rhinitis.    . Glucosamine-Chondroit-Vit C-Mn (GLUCOSAMINE CHONDR 1500 COMPLX) CAPS Take 1 capsule by mouth at bedtime.     Marland Kitchen loratadine (CLARITIN) 10 MG tablet Take 10 mg by mouth daily.    . minoxidil (ROGAINE) 2 % external solution Apply 1 application topically at bedtime.     . montelukast (SINGULAIR) 10 MG tablet Take one tablet once daily as directed 30 tablet 11  . meloxicam (MOBIC) 7.5 MG tablet Take 1 tablet (7.5 mg total) by mouth daily as needed for pain. 10 tablet 0   No current facility-administered medications for this visit.      Objective:  BP 120/74   Pulse 70   Temp 98.2 F (36.8 C)   Ht 5' 2.5" (1.588 m)   Wt 119 lb (54 kg)   SpO2 99%   BMI 21.42 kg/m  Gen: NAD, resting comfortably Tympanic membrane largely normal bilaterally. Oropharynx largely normal other than a canker sore at the back left Slight lymphadenopathy behind angle of left jaw CV: RRR no murmurs rubs or gallops Lungs: CTAB no crackles, wheeze, rhonchi Ext: no edema Skin: warm, dry MSK: Mild clicking and pain with palpation at TMJ    Assessment and Plan  #Left ear pain ear pain S: Around noon yesterday patient started with pain in the front of her  left ear rating slightly to angle of the jaw.  No neck pain reported.  No shoulder pain reported.  No chest pain reported.  No shortness of breath reported.  She states she seems to get pain every 3 to 10 minutes- can be a pulsating type pain or sharp pain.  Seems to last for at least a few seconds if not longer.  Can be at least a moderate level pain.  Denies tinnitus or increased hearing loss  Has had TMJ D years ago and wonders if this could be the issue.  Patient has tried Tylenol without any relief.  pt states she is having shoot sharp pain in her left ear that started around noon yesterday. She states she is unsure if it is TMJ or just related to her ear.  A/P: I am concerned patient may have 2 issues going on here- seems her TMJ may be flared up-we will use course of meloxicam for 5 days scheduled and then use prn.  She also has a small sore in her mouth- I think behind the angle of her left jaw she has some lymphadenopathy associated with this- I think this may be contributing to her pain.  We discussed monitoring over the next 2 weeks or so- if not improving with above treatment or symptoms worsen then instructed her to reach out to [place]- my team can place a referral to ENT if patient requests without follow-up office visit with me  We discussed risk of esophageal reflux and could trial Pepcid or Tums if needed.  Encouraged her to use meloxicam with food.  No CAD or history of gastric issues so I think a short-term course is reasonable-apparently she tolerated meloxicam years ago.  Recommended follow up:  Future Appointments  Date Time Provider [place]  09/11/2019  4:30 PM Kozlow, Donnamarie Poag, MD [facility] None   Lab/Order associations:   ICD-10-CM   1. Left ear pain  H92.02   2. Arthralgia of left temporomandibular joint  M26.622    Meds ordered this encounter  Medications  . meloxicam (MOBIC) 7.5 MG tablet    Sig: Take 1 tablet (7.5 mg total) by mouth daily as needed for pain.    Dispense:  10 tablet    Refill:  0    Return precautions advised.  Garret Reddish, MD

## 2019-09-04 NOTE — Patient Instructions (Addendum)
I think this is recurrence of your TMJD  No obvious infection on ear exam today  Lets try meloxicam once a day for next 5 days- you will have an additional 5 pills if needed but if doing much better at 5 days may stop.   Can use warm compress in area three times a day for 10 minutes or more frequently if needed  My hope is that within a week you are at least 50% better- if you are not improving or have new or worsening symptoms let me know- we would consider ENT referral or follow up with me first

## 2019-09-11 ENCOUNTER — Encounter: Payer: Self-pay | Admitting: Allergy and Immunology

## 2019-09-11 ENCOUNTER — Ambulatory Visit: Payer: Medicare Other | Admitting: Allergy and Immunology

## 2019-09-11 ENCOUNTER — Other Ambulatory Visit: Payer: Self-pay

## 2019-09-11 VITALS — BP 132/80 | HR 71 | Temp 97.7°F | Resp 16 | Ht 61.0 in | Wt 118.8 lb

## 2019-09-11 DIAGNOSIS — J3089 Other allergic rhinitis: Secondary | ICD-10-CM | POA: Diagnosis not present

## 2019-09-11 NOTE — Patient Instructions (Signed)
  1. Continue Zyrtec 10mg  daily   2. Continue montelukast 10 mg one tablet one time per day   3. Continue Flonase 1-2 sprays each nostril 1-7 times per week. Takes days to work  4. Can use nasal ipratropium 0.06%- 2 sprays each nostril every 6 hours if needed to dry up nose  5. Return to clinic in 12 months or earlier if problem  6. Obtain fl vaccine and COVID vaccine when available

## 2019-09-11 NOTE — Progress Notes (Signed)
[facility]   Follow-up Note  Referring Provider: Marin Olp, MD Primary Provider: Marin Olp, MD Date of Office Visit: 09/11/2019  Subjective:   ADAEZE BRASHIER (DOB: 19-Dec-1945) is a 73 y.o. female who returns to [facility] on 09/11/2019 in re-evaluation of the following:  HPI: Shanquia returns to this clinic in reevaluation of her allergic rhinitis and ETD and a history of inflammatory dermatosis.  Her last visit to this clinic was 12 September 2018.  She has really done very well other than having rather extensive rhinorrhea.  She does not have significant nasal congestion and she does not use her nasal steroid very often.  She continues to use montelukast on a pretty consistent basis as well as some occasional Zyrtec.  She does not have any associated ugly nasal discharge or anosmia.  Recently she did have acute left-sided face pain in association with jaw stiffness for which she was given a nonsteroidal anti-inflammatory drug assuming this was recrudescence of her TMJ and her issue has since resolved.  She has not had return of her inflammatory dermatosis.  Allergies as of 09/11/2019      Reactions   Latex Rash      Medication List      alendronate 70 MG tablet Commonly known as: FOSAMAX TAKE 1 TABLET BY MOUTH EVERY 7 (SEVEN) DAYS. TAKE WITH A FULL GLASS OF WATER ON AN EMPTY STOMACH.   calcium carbonate 600 MG Tabs tablet Commonly known as: OS-CAL Take 600 mg by mouth 2 (two) times daily with a meal.   cholecalciferol 25 MCG (1000 UT) tablet Commonly known as: VITAMIN D3 Take 1,000 Units by mouth daily.   fluocinonide 0.05 % external solution Commonly known as: LIDEX Apply 1 application topically 2 (two) times daily. Scalp rash   fluticasone 50 MCG/ACT nasal spray Commonly known as: FLONASE Place 2 sprays into both nostrils as needed for allergies or rhinitis.   Glucosamine Chondr 1500  Complx Caps Take 1 capsule by mouth at bedtime.   loratadine 10 MG tablet Commonly known as: CLARITIN Take 10 mg by mouth daily.   meloxicam 7.5 MG tablet Commonly known as: MOBIC Take 1 tablet (7.5 mg total) by mouth daily as needed for pain.   minoxidil 2 % external solution Commonly known as: ROGAINE Apply 1 application topically at bedtime.   montelukast 10 MG tablet Commonly known as: SINGULAIR Take one tablet once daily as directed   Vaniqa 13.9 % cream Generic drug: Eflornithine HCl Apply topically daily. Prescribed by dermatology       Past Medical History:  Diagnosis Date  . Allergy   . Arthritis   . Hyperlipidemia   . Neck pain, chronic   . 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 Rosacea    metrogel in past    Past Surgical History:  Procedure Laterality Date  . bcc excision     x3  . CHOLECYSTECTOMY    . COLONOSCOPY WITH PROPOFOL N/A 07/20/2016   Procedure: COLONOSCOPY WITH PROPOFOL;  Surgeon: Garlan Fair, MD;  Location: [facility];  Service: Endoscopy;  Laterality: N/A;  . EYE SURGERY  2011   bilateral cataract with lens implant  . HYSTEROSCOPY    . KNEE SURGERY  2018   arthroscopic for meniscal tear  . mohr's surgery     face  . TONSILLECTOMY    . TUBAL LIGATION  Review of systems negative except as noted in HPI / PMHx or noted below:  Review of Systems  Constitutional: Negative.   HENT: Negative.   Eyes: Negative.   Respiratory: Negative.   Cardiovascular: Negative.   Gastrointestinal: Negative.   Genitourinary: Negative.   Musculoskeletal: Negative.   Skin: Negative.   Neurological: Negative.   Endo/Heme/Allergies: Negative.   Psychiatric/Behavioral: Negative.      Objective:   Vitals:   09/11/19 1634  BP: 132/80  Pulse: 71  Resp: 16  Temp: 97.7 F (36.5 C)  SpO2: 98%   Height: 5\' 1"  (154.9 cm)  Weight: 118 lb 12.8 oz (53.9 kg)   Physical Exam Constitutional:       Appearance: She is not diaphoretic.  HENT:     Head: Normocephalic.     Right Ear: Tympanic membrane, ear canal and external ear normal.     Left Ear: Tympanic membrane, ear canal and external ear normal.     Nose: Nose normal. No mucosal edema or rhinorrhea.     Mouth/Throat:     Pharynx: Uvula midline. No oropharyngeal exudate.  Eyes:     Conjunctiva/sclera: Conjunctivae normal.  Neck:     Thyroid: No thyromegaly.     Trachea: Trachea normal. No tracheal tenderness or tracheal deviation.  Cardiovascular:     Rate and Rhythm: Normal rate and regular rhythm.     Heart sounds: Normal heart sounds, S1 normal and S2 normal. No murmur.  Pulmonary:     Effort: No respiratory distress.     Breath sounds: Normal breath sounds. No stridor. No wheezing or rales.  Lymphadenopathy:     Head:     Right side of head: No tonsillar adenopathy.     Left side of head: No tonsillar adenopathy.     Cervical: No cervical adenopathy.  Skin:    Findings: No erythema or rash.     Nails: There is no clubbing.   Neurological:     Mental Status: She is alert.     Diagnostics: none  Assessment and Plan:   1. Other allergic rhinitis     1. Continue Zyrtec 10mg  daily   2. Continue montelukast 10 mg one tablet one time per day   3. Continue Flonase 1-2 sprays each nostril 1-7 times per week. Takes days to work  4. Can use nasal ipratropium 0.06%- 2 sprays each nostril every 6 hours if needed to dry up nose  5. Return to clinic in 12 months or earlier if problem  6. Obtain fl vaccine and COVID vaccine when available  Tajanae is really doing quite well at this point in time and the only issue is her rhinorrhea and I given her nasal ipratropium to help with that issue.  If she continues to do this well I will just see her back in his clinic in 1 year or earlier if there is a problem.  [name], MD Allergy / Immunology [place]

## 2019-09-12 ENCOUNTER — Encounter: Payer: Self-pay | Admitting: Allergy and Immunology

## 2019-09-12 MED ORDER — MONTELUKAST SODIUM 10 MG PO TABS: ORAL_TABLET | ORAL | 11 refills | Status: DC

## 2019-09-12 MED ORDER — IPRATROPIUM BROMIDE 0.06 % NA SOLN: 2.0000 | Freq: Four times a day (QID) | NASAL | 5 refills | Status: AC

## 2019-09-12 NOTE — Addendum Note (Signed)
Addended by: Valere Dross on: 09/12/2019 08:42 AM   Modules accepted: Orders

## 2019-10-03 ENCOUNTER — Other Ambulatory Visit: Payer: Self-pay

## 2019-10-03 ENCOUNTER — Ambulatory Visit: Payer: Medicare Other | Admitting: Podiatry

## 2019-10-03 DIAGNOSIS — L603 Nail dystrophy: Secondary | ICD-10-CM | POA: Diagnosis not present

## 2019-10-04 ENCOUNTER — Telehealth: Payer: Self-pay | Admitting: Family Medicine

## 2019-10-04 NOTE — Telephone Encounter (Signed)
I left a message asking the patient to call and schedule Medicare AWV with Courtney ([facility]).  If patient calls back, please schedule Medicare Wellness Visit at next available opening.  VDM (Dee-Dee) °

## 2019-10-05 NOTE — Telephone Encounter (Signed)
See below

## 2019-10-05 NOTE — Telephone Encounter (Signed)
Patient called back to say that she will not do the AWV this year she is opting out. She was informed that it can be done from the comfort of her own home but she still declined. Please advise

## 2019-10-08 NOTE — Progress Notes (Signed)
   HPI: 73 y.o. female presenting today as a new patient with a chief complaint of thickened, discoloration of the left 2nd toenail that appeared about one month ago. She reports some associated redness around the nail. She denies modifying factors. She has been scraping the nail for treatment. Patient is here for further evaluation and treatment.   Past Medical History:  Diagnosis Date  . Allergy   . Arthritis   . Hyperlipidemia   . Neck pain, chronic   . 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 Rosacea    metrogel in past     Physical Exam: General: The patient is alert and oriented x3 in no acute distress.  Dermatology: Dystrophic nail noted to the left 2nd toe. Skin is warm, dry and supple bilateral lower extremities. Negative for open lesions or macerations.  Vascular: Palpable pedal pulses bilaterally. No edema or erythema noted. Capillary refill within normal limits.  Neurological: Epicritic and protective threshold grossly intact bilaterally.   Musculoskeletal Exam: Range of motion within normal limits to all pedal and ankle joints bilateral. Muscle strength 5/5 in all groups bilateral.    Assessment: 1. Dystrophic nail left 2nd digit   Plan of Care:  1. Patient evaluated.   2. Continue wearing good shoes.  3. Maintain good foot hygiene.  4. Return to clinic as needed.       Edrick Kins, DPM [facility]  Dr. Edrick Kins, DPM    [address], [place] [address]                Office [phone]  Fax [phone]

## 2019-12-05 ENCOUNTER — Encounter: Payer: Self-pay | Admitting: Family Medicine

## 2019-12-06 NOTE — Telephone Encounter (Signed)
Duplicate message have sent one to Dr. Yong Channel for reply.

## 2020-02-03 ENCOUNTER — Other Ambulatory Visit: Payer: Self-pay | Admitting: Family Medicine

## 2020-02-04 NOTE — Telephone Encounter (Signed)
.  hp

## 2020-02-04 NOTE — Telephone Encounter (Signed)
LAST APPOINTMENT DATE: 09/04/2019  NEXT APPOINTMENT DATE: not found   LAST REFILL:07/11/2019   QTY:93ml 1rf

## 2020-02-05 NOTE — Telephone Encounter (Signed)
Yes thanks- may refill but state 7-10 days maximum then take a break for at least a week

## 2020-03-11 ENCOUNTER — Other Ambulatory Visit: Payer: Self-pay | Admitting: Family Medicine

## 2020-03-13 IMAGING — MG DIGITAL SCREENING BILATERAL MAMMOGRAM WITH TOMO AND CAD
8 series · 9 of 24 positions shown · non-contrast
Comparison: Previous exam(s).

CLINICAL DATA: Screening.

EXAM:
DIGITAL SCREENING BILATERAL MAMMOGRAM WITH TOMO AND CAD

[R CC synth-2D]
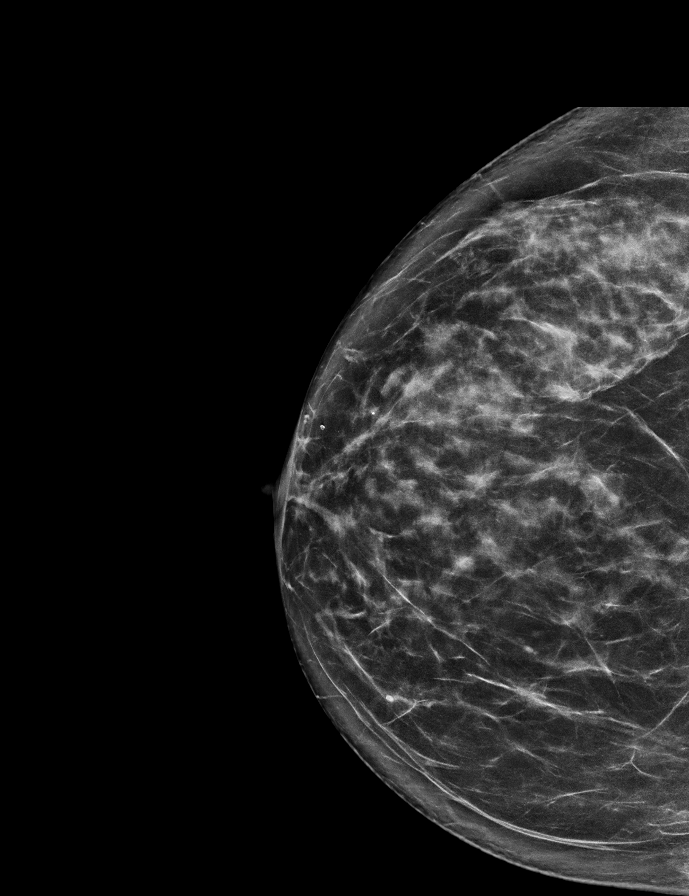

[L CC synth-2D]
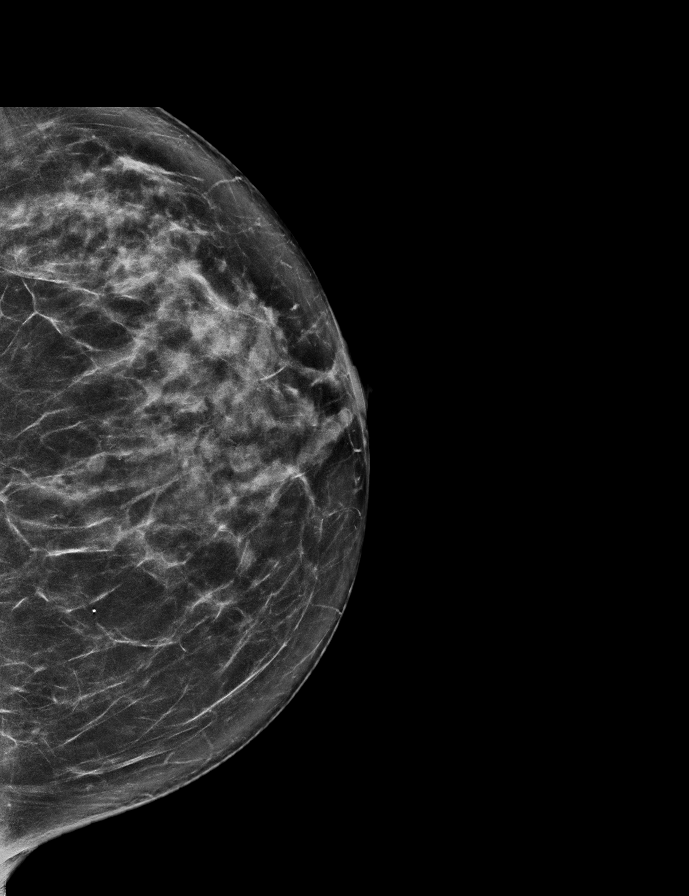

[R MLO synth-2D]
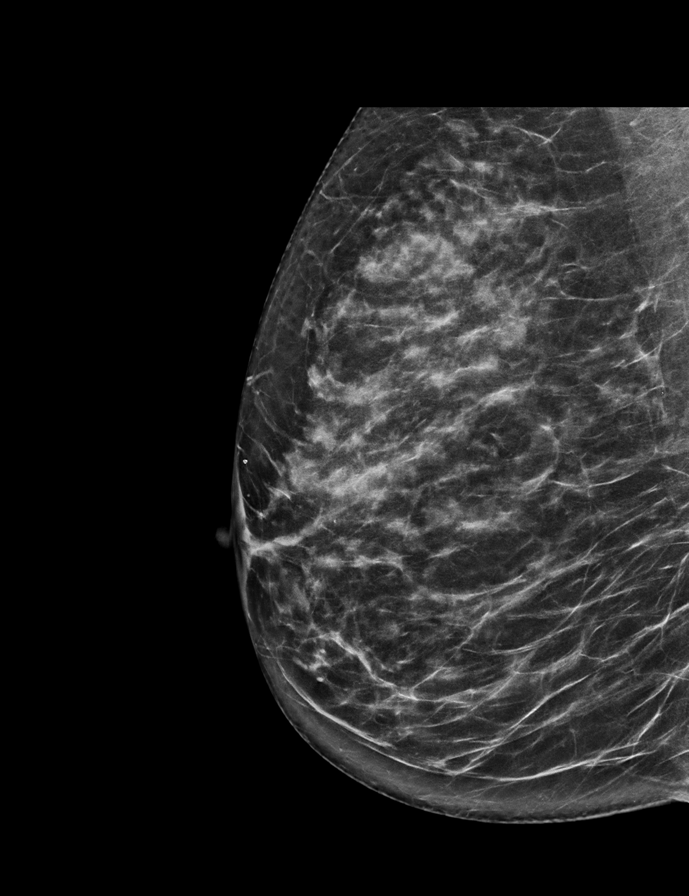

[L MLO synth-2D]
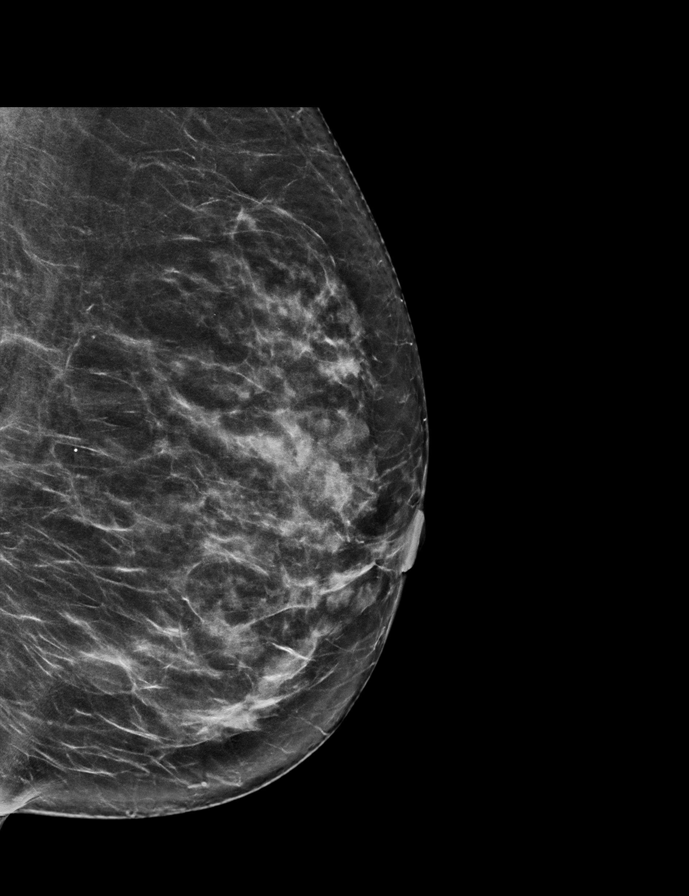

[L CC tomo · 2 of 70 frames shown]
[frame 23/70]
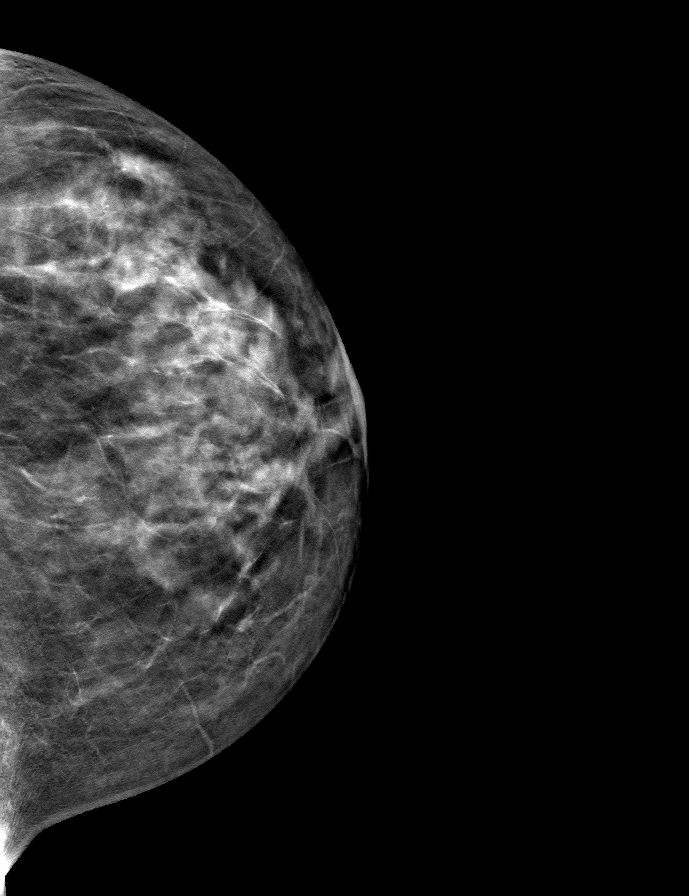
[frame 35/70]
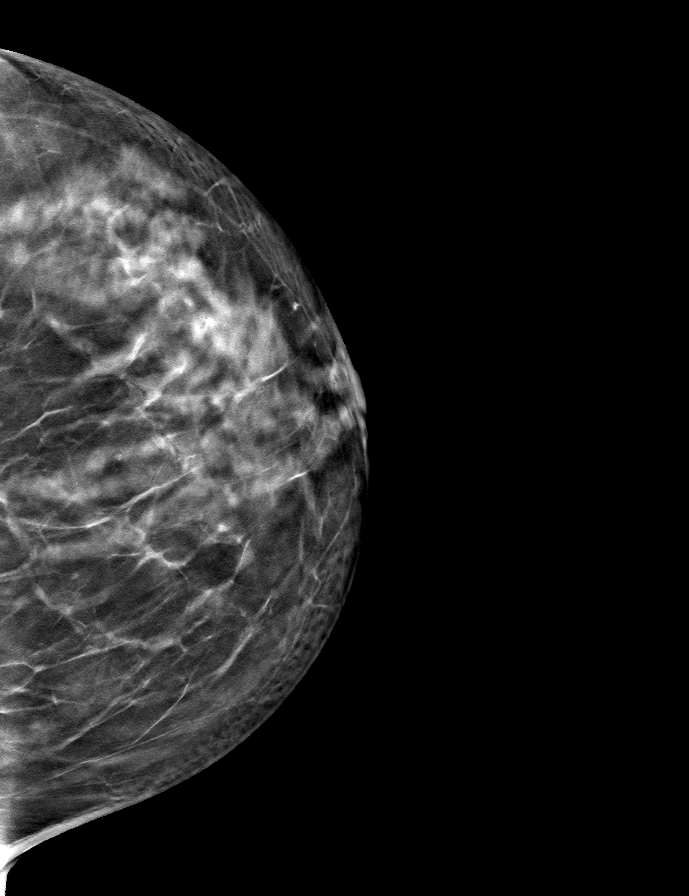

[L MLO tomo · tomo slice 35/69.0]
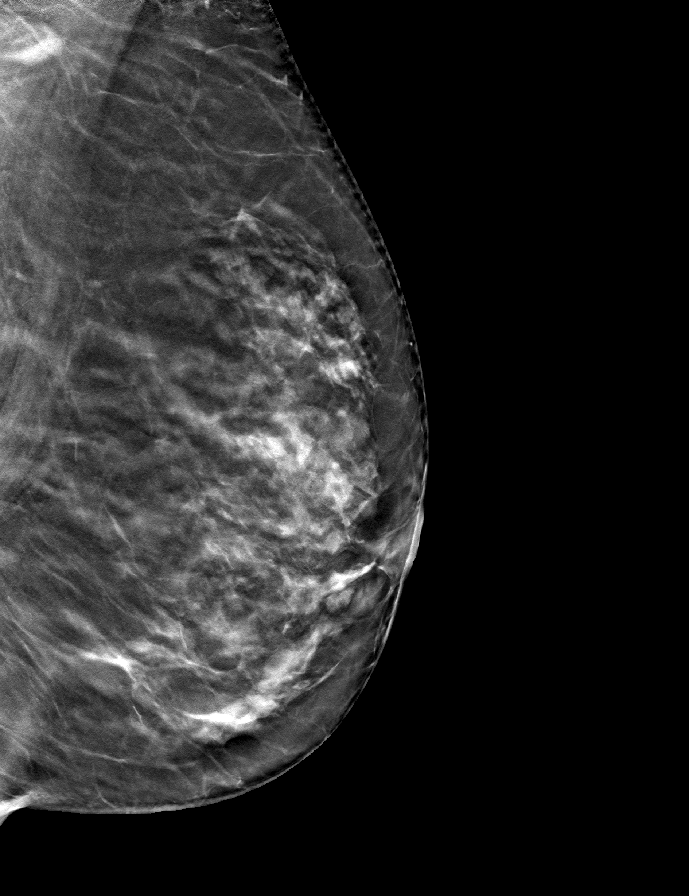

[R CC tomo · tomo slice 37/72.0]
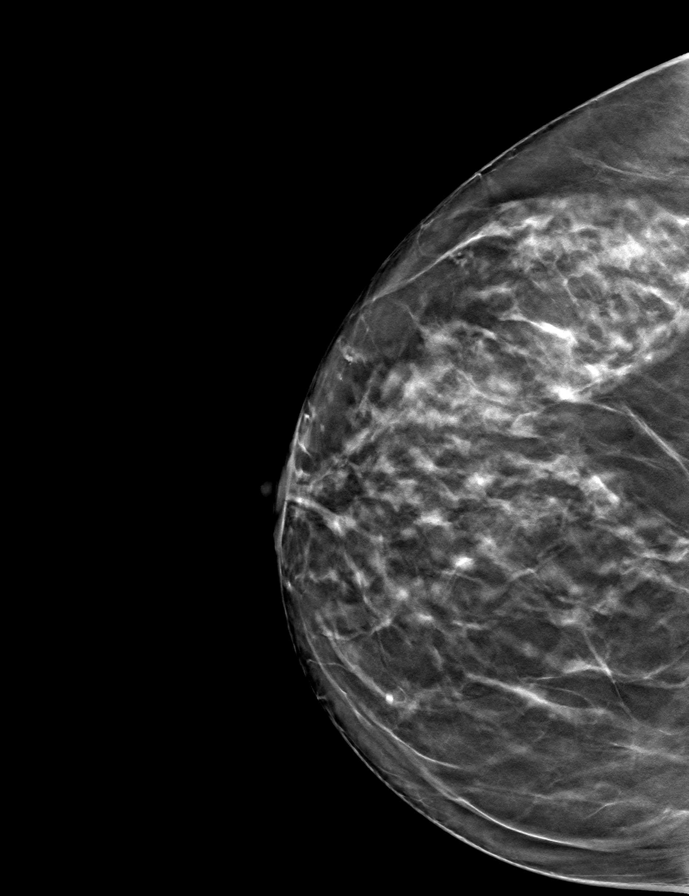

[R MLO tomo · tomo slice 35/69.0]
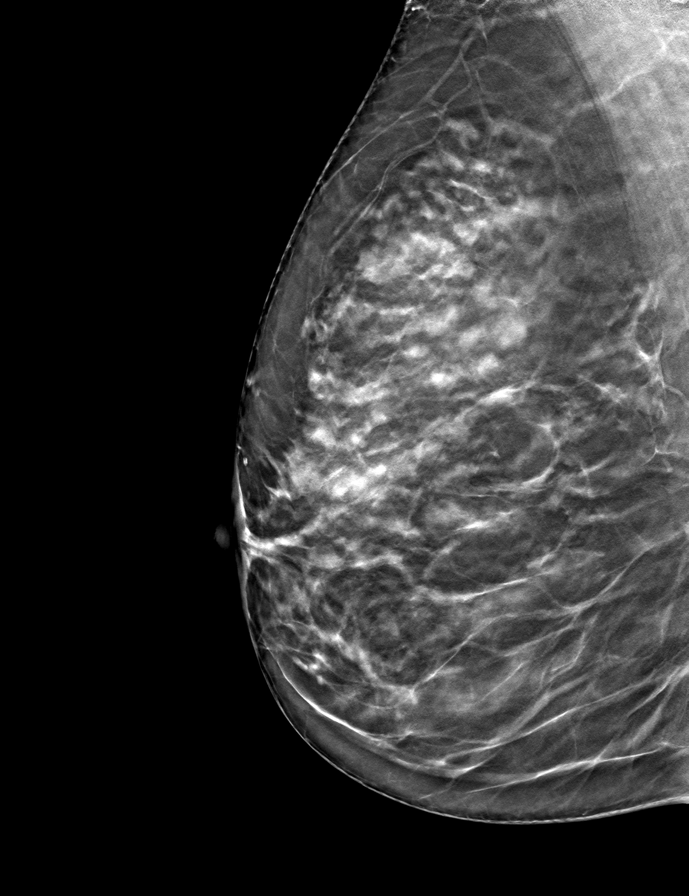

[9 of 24 positions shown; findings below may reference images not displayed]

ACR Breast Density Category c: The breast tissue is heterogeneously
dense, which may obscure small masses.
FINDINGS: There are no findings suspicious for malignancy. Images were
processed with CAD.
IMPRESSION: No mammographic evidence of malignancy. A result letter of this
screening mammogram will be mailed directly to the patient.

RECOMMENDATION:
Screening mammogram in one year. (Code:[id number]-[id number])

BI-RADS CATEGORY  1: Negative.

## 2020-03-31 ENCOUNTER — Other Ambulatory Visit: Payer: Self-pay | Admitting: Family Medicine

## 2020-03-31 DIAGNOSIS — Z1231 Encounter for screening mammogram for malignant neoplasm of breast: Secondary | ICD-10-CM

## 2020-04-09 ENCOUNTER — Encounter: Payer: Self-pay | Admitting: Family Medicine

## 2020-04-09 ENCOUNTER — Other Ambulatory Visit: Payer: Self-pay

## 2020-04-09 ENCOUNTER — Ambulatory Visit (INDEPENDENT_AMBULATORY_CARE_PROVIDER_SITE_OTHER): Payer: Medicare PPO | Admitting: Family Medicine

## 2020-04-09 VITALS — BP 110/68 | HR 69 | Temp 98.2°F | Ht 61.0 in | Wt 116.0 lb

## 2020-04-09 DIAGNOSIS — R109 Unspecified abdominal pain: Secondary | ICD-10-CM | POA: Diagnosis not present

## 2020-04-09 LAB — CBC WITH DIFFERENTIAL/PLATELET
Basophils Absolute: 0 10*3/uL (ref 0.0–0.1)
Basophils Relative: 0.6 % (ref 0.0–3.0)
Eosinophils Absolute: 0.1 10*3/uL (ref 0.0–0.7)
Eosinophils Relative: 0.9 % (ref 0.0–5.0)
HCT: 43.5 % (ref 36.0–46.0)
Hemoglobin: 14.8 g/dL (ref 12.0–15.0)
Lymphocytes Relative: 23.3 % (ref 12.0–46.0)
Lymphs Abs: 1.5 10*3/uL (ref 0.7–4.0)
MCHC: 34.1 g/dL (ref 30.0–36.0)
MCV: 94.1 fl (ref 78.0–100.0)
Monocytes Absolute: 0.4 10*3/uL (ref 0.1–1.0)
Monocytes Relative: 5.9 % (ref 3.0–12.0)
Neutro Abs: 4.6 10*3/uL (ref 1.4–7.7)
Neutrophils Relative %: 69.3 % (ref 43.0–77.0)
Platelets: 238 10*3/uL (ref 150.0–400.0)
RBC: 4.63 Mil/uL (ref 3.87–5.11)
RDW: 14.5 % (ref 11.5–15.5)
WBC: 6.6 10*3/uL (ref 4.0–10.5)

## 2020-04-09 LAB — COMPREHENSIVE METABOLIC PANEL
ALT: 11 U/L (ref 0–35)
AST: 15 U/L (ref 0–37)
Albumin: 4.4 g/dL (ref 3.5–5.2)
Alkaline Phosphatase: 62 U/L (ref 39–117)
BUN: 21 mg/dL (ref 6–23)
CO2: 29 mEq/L (ref 19–32)
Calcium: 9.3 mg/dL (ref 8.4–10.5)
Chloride: 104 mEq/L (ref 96–112)
Creatinine, Ser: 0.73 mg/dL (ref 0.40–1.20)
GFR: 77.53 mL/min (ref >60.00)
Glucose, Bld: 100 mg/dL — ABNORMAL HIGH (ref 70–99)
Potassium: 4.6 mEq/L (ref 3.5–5.1)
Sodium: 140 mEq/L (ref 135–145)
Total Bilirubin: 0.5 mg/dL (ref 0.2–1.2)
Total Protein: 6.7 g/dL (ref 6.0–8.3)

## 2020-04-09 LAB — POCT URINALYSIS DIPSTICK
Bilirubin, UA: NEGATIVE
Blood, UA: NEGATIVE
Glucose, UA: NEGATIVE
Ketones, UA: NEGATIVE
Leukocytes, UA: NEGATIVE
Nitrite, UA: NEGATIVE
Protein, UA: NEGATIVE
Spec Grav, UA: 1.01 (ref 1.010–1.025)
Urobilinogen, UA: 0.2 E.U./dL
pH, UA: 7.5 (ref 5.0–8.0)

## 2020-04-09 MED ORDER — MELOXICAM 7.5 MG PO TABS: 7.5000 mg | ORAL_TABLET | Freq: Every day | ORAL | 0 refills | Status: DC | PRN

## 2020-04-09 NOTE — Progress Notes (Signed)
Phone [phone] In person visit     I,Donna Orphanos,acting as a scribe for Garret Reddish, MD.,have documented all relevant documentation on the behalf of Garret Reddish, MD,as directed by  Garret Reddish, MD while in the presence of Garret Reddish, MD.  Subjective:   Rhonda [name] is a 74 y.o. year old very pleasant female patient who presents for/with See problem oriented charting Chief Complaint  Patient presents with  . Flank Pain   This visit occurred during the SARS-CoV-2 public health emergency.  Safety protocols were in place, including screening questions prior to the visit, additional usage of staff PPE, and extensive cleaning of exam room while observing appropriate contact time as indicated for disinfecting solutions.   Past Medical History-  Patient Active Problem List   Diagnosis Date Noted  . Idiopathic urticaria 06/16/2017    Priority: Medium  . Hyperlipidemia 09/05/2006    Priority: Medium  . Rash 07/11/2019    Priority: Low  . Osteoporosis 11/15/2014    Priority: Low  . Allergic rhinitis 11/15/2014    Priority: Low  . History of basal cell cancer 11/15/2014    Priority: Low  . Rosacea     Priority: Low  . Osteoarthritis 11/18/2011    Priority: Low  . Hematuria 09/25/2008    Priority: Low  . Dysuria 01/12/2019  . Prolapse of female genital organs 01/12/2019  . Ocular migraine 02/16/2018  . Bilateral shoulder pain 11/15/2014  . Posterior capsular opacification, left 10/18/2013  . Viral conjunctivitis 09/06/2012  . Pseudophakia 08/25/2012  . Choroidal nevus of right eye 10/15/2011  . Posterior vitreous detachment of both eyes 10/15/2011  . Status post intraocular lens implant 10/15/2011    Medications- reviewed and updated Current Outpatient Medications  Medication Sig Dispense Refill  . alendronate (FOSAMAX) 70 MG tablet TAKE 1 TABLET BY MOUTH EVERY 7 (SEVEN) DAYS. TAKE WITH A FULL GLASS OF WATER ON AN EMPTY STOMACH. 12 tablet 3  . calcium  carbonate (OS-CAL) 600 MG TABS Take 600 mg by mouth 2 (two) times daily with a meal.      . cholecalciferol (VITAMIN D3) 25 MCG (1000 UT) tablet Take 1,000 Units by mouth daily.    . fluocinonide (LIDEX) 0.05 % external solution APPLY TOPICALLY 2 (TWO) TIMES DAILY. SCALP RASH 60 mL 1  . fluticasone (FLONASE) 50 MCG/ACT nasal spray Place 2 sprays into both nostrils as needed for allergies or rhinitis.    . Glucosamine-Chondroit-Vit C-Mn (GLUCOSAMINE CHONDR 1500 COMPLX) CAPS Take 1 capsule by mouth at bedtime.     Marland Kitchen ipratropium (ATROVENT) 0.06 % nasal spray Place 2 sprays into both nostrils 4 (four) times daily. 15 mL 5  . loratadine (CLARITIN) 10 MG tablet Take 10 mg by mouth daily.    . meloxicam (MOBIC) 7.5 MG tablet Take 1 tablet (7.5 mg total) by mouth daily as needed for pain. 10 tablet 0  . minoxidil (ROGAINE) 2 % external solution Apply 1 application topically at bedtime.     . montelukast (SINGULAIR) 10 MG tablet Take one tablet once daily as directed 30 tablet 11   No current facility-administered medications for this visit.     Objective:  BP 110/68 (BP Location: Left Arm, Patient Position: Sitting, Cuff Size: Normal)   Pulse 69   Temp 98.2 F (36.8 C) (Temporal)   Ht 5\' 1"  (1.549 m)   Wt 116 lb (52.6 kg)   SpO2 98%   BMI 21.92 kg/m  Gen: NAD, resting comfortably CV: RRR no murmurs  rubs or gallops Lungs: CTAB no crackles, wheeze, rhonchi Abdomen: soft/nontender/nondistended/normal bowel sounds. No rebound or guarding.  No pain with palpation over right flank.  No CVA tenderness. MSK: Full range of motion at lumbar spine.  When she arches her back does get some pain in the area and also when she twists to the left and the right.  Results for orders placed or performed in visit on 04/09/20 (from the past 24 hour(s))  POCT urinalysis dipstick     Status: None   Collection Time: 04/09/20  9:55 AM  Result Value Ref Range   Color, UA Yellow    Clarity, UA Clear    Glucose, UA  Negative Negative   Bilirubin, UA Negative    Ketones, UA Negative    Spec Grav, UA 1.010 1.010 - 1.025   Blood, UA Negative    pH, UA 7.5 5.0 - 8.0   Protein, UA Negative Negative   Urobilinogen, UA 0.2 0.2 or 1.0 E.U./dL   Nitrite, UA Negative    Leukocytes, UA Negative Negative   Appearance     Odor         Assessment and Plan   # RightFlank pain S: Pt c/o right flank pain x 3 weeks Seems to be worsening over time. Laying on it can trigger it. Also brushing hair with right arm can trigger it or even lifting left leg up at times.   Pain is throughout the day and worst pain gets up to is 7/10. At the moment is about 7/10 as well. Dull most of the time but can be sharp. She is having some urinary urgency and frequency- just going on this morning though. Denies dysuria.  She has been doing some Qigong exercising- moving meditation for several months- does not think she has had a particular injury- wonders if she had overstretched.   Tylenol is helpful. Has not tried heat or ice.   No blood in urine- has had prior workup with urology reassuring and no kidney stones.  A/P: 74 year old female with right-sided flank pain worse with movement.  No history of kidney stones.  No blood in the urine today to suggest kidney stones.  We will get a urine culture to make sure there is not an infection as she also has some urgency and frequency as of today.  I do suspect this is musculoskeletal- possibly related to Qigong exercises shes doing.  She would like to get some blood work to be on the safe side-we will check blood counts and kidney and liver function.  If her blood work is normal I offered her referral to sports medicine-we will actually go ahead and place this today to see Dr. Tamala Julian for consideration of osteopathic manipulation or further imaging  She will try ice/heat-see after visit summary.  I also am going to give her 10 days of meloxicam-she tolerated this very well for TMJD back in  October without side effects  Recommended follow up: Return for as needed for new, worsening, persistent symptoms. Future Appointments  Date Time Provider [place]  04/25/2020 10:20 AM [facility]  07/15/2020  1:20 PM Marin Olp, MD [facility]  09/16/2020 10:30 AM Kozlow, Donnamarie Poag, MD [facility] None    Lab/Order associations:   ICD-10-CM   1. Right flank pain  R10.9 POCT urinalysis dipstick    Urine Culture    CBC with Differential/Platelet    Comprehensive metabolic panel    Ambulatory referral to Sports  Medicine    Meds ordered this encounter  Medications  . meloxicam (MOBIC) 7.5 MG tablet    Sig: Take 1 tablet (7.5 mg total) by mouth daily as needed for pain.    Dispense:  10 tablet    Refill:  0   The entirety of the information documented in the History of Present Illness, Review of Systems and Physical Exam were personally obtained by me. Portions of this information were initially documented by the CMA and reviewed by me for thoroughness and accuracy.   Garret Reddish, MD  Return precautions advised.  Garret Reddish, MD

## 2020-04-09 NOTE — Patient Instructions (Addendum)
Health Maintenance Due  Topic Date Due  . COVID-19 Vaccine (1)  Team she has had both COVID-19 vaccinations-please update record Never done   We will call you within two weeks about your referral to Dr. Tamala Julian of sports medicine. If you do not hear within 3 weeks, give [place] a call.  -can cancel visit at least 24 hours ahead of time if symptoms resolve with below treatments  I want you to ice your right low back 3x a day for 20 minutes. After 3 days I want you to try heat 3x a day for 20 minutes. Tylenol is ok for pain. If you have new or worsening symptoms please let us know immediately   I also am going to give her 10 days of meloxicam-she tolerated this very well for TMJD back in October without side effects   Please stop by lab before you go If you have mychart- we will send your results within 3 business days of [place] receiving them.  If you do not have mychart- we will call you about results within 5 business days of [place] receiving them.    Recommended follow up: Return for as needed for new, worsening, persistent symptoms.

## 2020-04-10 ENCOUNTER — Other Ambulatory Visit: Payer: Self-pay

## 2020-04-10 DIAGNOSIS — R109 Unspecified abdominal pain: Secondary | ICD-10-CM

## 2020-04-10 LAB — URINE CULTURE
MICRO NUMBER:: 10469387
Result:: NO GROWTH
SPECIMEN QUALITY:: ADEQUATE

## 2020-04-16 DIAGNOSIS — L821 Other seborrheic keratosis: Secondary | ICD-10-CM | POA: Diagnosis not present

## 2020-04-16 DIAGNOSIS — L820 Inflamed seborrheic keratosis: Secondary | ICD-10-CM | POA: Diagnosis not present

## 2020-04-16 DIAGNOSIS — L720 Epidermal cyst: Secondary | ICD-10-CM | POA: Diagnosis not present

## 2020-04-16 DIAGNOSIS — L603 Nail dystrophy: Secondary | ICD-10-CM | POA: Diagnosis not present

## 2020-04-16 DIAGNOSIS — Z85828 Personal history of other malignant neoplasm of skin: Secondary | ICD-10-CM | POA: Diagnosis not present

## 2020-04-16 DIAGNOSIS — L218 Other seborrheic dermatitis: Secondary | ICD-10-CM | POA: Diagnosis not present

## 2020-04-16 DIAGNOSIS — L738 Other specified follicular disorders: Secondary | ICD-10-CM | POA: Diagnosis not present

## 2020-04-16 DIAGNOSIS — D1723 Benign lipomatous neoplasm of skin and subcutaneous tissue of right leg: Secondary | ICD-10-CM | POA: Diagnosis not present

## 2020-04-16 DIAGNOSIS — D2261 Melanocytic nevi of right upper limb, including shoulder: Secondary | ICD-10-CM | POA: Diagnosis not present

## 2020-04-25 ENCOUNTER — Ambulatory Visit
Admission: RE | Admit: 2020-04-25 | Discharge: 2020-04-25 | Disposition: A | Discharge status: Home / Self Care | Payer: Medicare PPO | Source: Ambulatory Visit | Attending: Family Medicine | Admitting: Family Medicine

## 2020-04-25 ENCOUNTER — Other Ambulatory Visit: Payer: Self-pay

## 2020-04-25 DIAGNOSIS — Z1231 Encounter for screening mammogram for malignant neoplasm of breast: Secondary | ICD-10-CM | POA: Diagnosis not present

## 2020-05-08 ENCOUNTER — Ambulatory Visit: Payer: Medicare PPO | Admitting: Family Medicine

## 2020-05-08 ENCOUNTER — Other Ambulatory Visit: Payer: Self-pay

## 2020-05-08 ENCOUNTER — Ambulatory Visit (INDEPENDENT_AMBULATORY_CARE_PROVIDER_SITE_OTHER): Payer: Medicare PPO

## 2020-05-08 ENCOUNTER — Encounter: Payer: Self-pay | Admitting: Family Medicine

## 2020-05-08 VITALS — BP 120/80 | HR 58 | Ht 61.0 in | Wt 115.0 lb

## 2020-05-08 DIAGNOSIS — G8929 Other chronic pain: Secondary | ICD-10-CM

## 2020-05-08 DIAGNOSIS — M549 Dorsalgia, unspecified: Secondary | ICD-10-CM | POA: Insufficient documentation

## 2020-05-08 DIAGNOSIS — M810 Age-related osteoporosis without current pathological fracture: Secondary | ICD-10-CM

## 2020-05-08 DIAGNOSIS — M545 Low back pain, unspecified: Secondary | ICD-10-CM

## 2020-05-08 NOTE — Assessment & Plan Note (Signed)
Patient is a more of a right-sided back pain.  Seems to be more in the thoracolumbar juncture.  With history of osteoporosis we will get x-rays to further evaluate for any compression fracture that could be contributing.  Patient now is improving already and likely was more of a muscle injury of either the hip flexor proximally or even the latissimus dorsi based on the rotational component causing pain.  No masses appreciated.  Patient does have some mild degenerative scoliosis.  Discussed the meloxicam, Tylenol, icing regimen and topical anti-inflammatories.  Patient work with Product/process development scientist to learn home exercises.  Follow-up again in 4 to 6 weeks for further evaluation and treatment.  Previous laboratory work-up by primary care provider was unremarkable and completely appropriate

## 2020-05-08 NOTE — Progress Notes (Signed)
Rhonda [name] [address] [place] Phone: [phone] Subjective:   I Kandace Blitz am serving as a Education administrator for Dr. Hulan Saas.  This visit occurred during the SARS-CoV-2 public health emergency.  Safety protocols were in place, including screening questions prior to the visit, additional usage of staff PPE, and extensive cleaning of exam room while observing appropriate contact time as indicated for disinfecting solutions.   I'm seeing this patient by the request  of:  Marin Olp, MD  CC: Right sided flank pain  [id number]  Rhonda [name] is a 74 y.o. female coming in with complaint of right flank pain. Patient states the pain has improved but she is still having issues. Chronic pain. Sitting can be painful and twisting to the left as well. Patient walks about 40 minutes every morning and had to discontinue walking because it caused pain. No pain with walking now. Patient has tried ice, heat, and maxifreeze. She is also taking acetaminophen. Dr. Yong Channel prescribed meloxicam.  1-2/10 at its worse. Pain is achy.       Past Medical History:  Diagnosis Date   Allergy    Arthritis    Hyperlipidemia    Neck pain, chronic    RBBB (right bundle branch block with left anterior fascicular block)    Rosacea    metrogel in past   Past Surgical History:  Procedure Laterality Date   bcc excision     x3   CHOLECYSTECTOMY     COLONOSCOPY WITH PROPOFOL N/A 07/20/2016   Procedure: COLONOSCOPY WITH PROPOFOL;  Surgeon: Garlan Fair, MD;  Location: [facility];  Service: Endoscopy;  Laterality: N/A;   EYE SURGERY  2011   bilateral cataract with lens implant   HYSTEROSCOPY     KNEE SURGERY  2018   arthroscopic for meniscal tear   mohr's surgery     face   TONSILLECTOMY     TUBAL LIGATION     Social History   Socioeconomic History   Marital status: Married    Spouse name: Not on file   Number of  children: Not on file   Years of education: Not on file   Highest education level: Not on file  Occupational History   Not on file  Tobacco Use   Smoking status: Former Smoker    Packs/day: 0.75    Years: 10.00    Pack years: 7.50    Types: Cigarettes    Quit date: 37    Years since quitting: 49.4   Smokeless tobacco: Never Used   Tobacco comment: quit at age 70  Vaping Use   Vaping Use: Never used  Substance and Sexual Activity   Alcohol use: Yes    Alcohol/week: 7.0 standard drinks    Types: 7 Glasses of wine per week    Comment: one glass of wine a day   Drug use: No   Sexual activity: Not on file  Other Topics Concern   Not on file  Social History Narrative   Married 1972. 1 living child daughter [name]. lost son 64 years old to [place] in 2017. No grandkids. 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    Social Determinants of Health   Financial Resource Strain:    Difficulty of Paying Living Expenses:   Food Insecurity:    Worried About Running Out  of Food in the Last Year:    Arboriculturist in the Last Year:   Transportation Needs:    Film/video editor (Medical):    Lack of Transportation (Non-Medical):   Physical Activity:    Days of Exercise per Week:    Minutes of Exercise per Session:   Stress:    Feeling of Stress :   Social Connections:    Frequency of Communication with Friends and Family:    Frequency of Social Gatherings with Friends and Family:    Attends Religious Services:    Active Member of Clubs or Organizations:    Attends Music therapist:    Marital Status:    Allergies  Allergen Reactions   Latex Rash   Family History  Problem Relation Age of Onset   Alzheimer's disease Mother        died of bowel rupture   Paget's disease of bone Mother    Cancer Father        unknown   CAD  Son        57- died of MI   Breast cancer Daughter 32    Current Outpatient Medications (Endocrine & Metabolic):    alendronate (FOSAMAX) 70 MG tablet, TAKE 1 TABLET BY MOUTH EVERY 7 (SEVEN) DAYS. TAKE WITH A FULL GLASS OF WATER ON AN EMPTY STOMACH.   Current Outpatient Medications (Respiratory):    fluticasone (FLONASE) 50 MCG/ACT nasal spray, Place 2 sprays into both nostrils as needed for allergies or rhinitis.   ipratropium (ATROVENT) 0.06 % nasal spray, Place 2 sprays into both nostrils 4 (four) times daily.   loratadine (CLARITIN) 10 MG tablet, Take 10 mg by mouth daily.   montelukast (SINGULAIR) 10 MG tablet, Take one tablet once daily as directed    Current Outpatient Medications (Other):    calcium carbonate (OS-CAL) 600 MG TABS, Take 600 mg by mouth 2 (two) times daily with a meal.     cholecalciferol (VITAMIN D3) 25 MCG (1000 UT) tablet, Take 1,000 Units by mouth daily.   fluocinonide (LIDEX) 0.05 % external solution, APPLY TOPICALLY 2 (TWO) TIMES DAILY. SCALP RASH   Glucosamine-Chondroit-Vit C-Mn (GLUCOSAMINE CHONDR 1500 COMPLX) CAPS, Take 1 capsule by mouth at bedtime.    minoxidil (ROGAINE) 2 % external solution, Apply 1 application topically at bedtime.    Reviewed prior external information including notes and imaging from  primary care provider As well as notes that were available from care everywhere and other healthcare systems.  Past medical history, social, surgical and family history all reviewed in electronic medical record.  No pertanent information unless stated regarding to the chief complaint.   Review of Systems:  No headache, visual changes, nausea, vomiting, diarrhea, constipation, dizziness, abdominal pain, skin rash, fevers, chills, night sweats, weight loss, swollen lymph nodes, body aches, joint swelling, chest pain, shortness of breath, mood changes. POSITIVE muscle aches  Objective  Blood pressure 120/80, pulse (!) 58, height 5\' 1"   (1.549 m), weight 115 lb (52.2 kg), SpO2 97 %.   General: No apparent distress alert and oriented x3 mood and affect normal, dressed appropriately.  HEENT: Pupils equal, extraocular movements intact  Respiratory: Patient's speak in full sentences and does not appear short of breath  Cardiovascular: No lower extremity edema, non tender, no erythema  Neuro: Cranial nerves II through XII are intact, neurovascularly intact in all extremities with 2+ DTRs and 2+ pulses.  Gait normal with good balance and coordination.  MSK: Mild arthritic changes  of multiple joints Back exam shows significant tightness noted in the thoracolumbar juncture.  No masses appreciated.  Mild tenderness to palpation no in this area.  Patient does have some very mild limited range of motion with rotation to the right compared to the left.  Mild increase in discomfort with FABER test on the right compared to left but negative straight leg test and no radicular symptoms.  5 out of 5 symmetric strength.   [id number]; 15 additional minutes spent for Therapeutic exercises as stated in above notes.  This included exercises focusing on stretching, strengthening, with significant focus on eccentric aspects.   Long term goals include an improvement in range of motion, strength, endurance as well as avoiding reinjury. Patient's frequency would include in 1-2 times a day, 3-5 times a week for a duration of 6-12 weeks. Low back exercises that included:  Pelvic tilt/bracing instruction to focus on control of the pelvic girdle and lower abdominal muscles  Glute strengthening exercises, focusing on proper firing of the glutes without engaging the low back muscles Proper stretching techniques for maximum relief for the hamstrings, hip flexors, low back and some rotation where tolerated   Proper technique shown and discussed handout in great detail with ATC.  All questions were discussed and answered.     Impression and Recommendations:     The  above documentation has been reviewed and is accurate and complete Lyndal Pulley, DO       Note: This dictation was prepared with Dragon dictation along with smaller phrase technology. Any transcriptional errors that result from this process are unintentional.

## 2020-05-08 NOTE — Patient Instructions (Signed)
Xray today Exercise 3 times a week Ice after activity Pen[place]aid small amount 2 times a day Vitamin D 2000-4000 daily Tylenol 3 times a day Meloxicam if taking take it 3-5 days straight  See me again in 6-8 weeks

## 2020-06-25 ENCOUNTER — Encounter: Payer: Self-pay | Admitting: Family Medicine

## 2020-06-25 ENCOUNTER — Other Ambulatory Visit: Payer: Self-pay

## 2020-06-25 ENCOUNTER — Ambulatory Visit: Payer: Medicare PPO | Admitting: Family Medicine

## 2020-06-25 DIAGNOSIS — M545 Low back pain, unspecified: Secondary | ICD-10-CM

## 2020-06-25 DIAGNOSIS — G8929 Other chronic pain: Secondary | ICD-10-CM | POA: Diagnosis not present

## 2020-06-25 NOTE — Assessment & Plan Note (Signed)
Patient is doing remarkably well with 100% improvement after the home exercises and vitamin supplementations.  At this point I think patient can continue with conservative therapy and patient will see me as needed

## 2020-06-25 NOTE — Progress Notes (Signed)
[place] [facility] Phone: [phone] Subjective:   Rhonda [name], am serving as a scribe for Dr. Hulan [name]. This visit occurred during the SARS-CoV-2 public health emergency.  Safety protocols were in place, including screening questions prior to the visit, additional usage of staff PPE, and extensive cleaning of exam room while observing appropriate contact time as indicated for disinfecting solutions.   I'm seeing this patient by the request  of:  Rhonda Olp, MD  CC: Right-sided back pain follow-up  TWS:[id number]   05/08/2020 Patient is a more of a right-sided back pain.  Seems to be more in the thoracolumbar juncture.  With history of osteoporosis we will get x-rays to further evaluate for any compression fracture that could be contributing.  Patient now is improving already and likely was more of a muscle injury of either the hip flexor proximally or even the latissimus dorsi based on the rotational component causing pain.  [name] masses appreciated.  Patient does have some mild degenerative scoliosis.  Discussed the meloxicam, Tylenol, icing regimen and topical anti-inflammatories.  Patient work with Product/process development scientist to learn home exercises.  Follow-up again in 4 to 6 weeks for further evaluation and treatment.  Previous laboratory work-up by primary care provider was unremarkable and completely appropriate  Update 06/25/2020 Rhonda [name] is a 74 y.o. female coming in with complaint of back pain. States that her back pain has improved. Less pain with rotation. [name] pain throughout her day.  Patient was states that she is 100% better at this time.  Has not noticed any significant discomfort.  Has been walking and doing the exercises.  Patient did have lumbar x-rays at last exam that did show severe degenerative disc disease with degenerative scoliosis noted.     Past Medical History:  Diagnosis Date  . Allergy     . Arthritis   . Hyperlipidemia   . Neck pain, chronic   . 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 Rosacea    metrogel in past   Past Surgical History:  Procedure Laterality Date  . bcc excision     x3  . CHOLECYSTECTOMY    . COLONOSCOPY WITH PROPOFOL N/A 07/20/2016   Procedure: COLONOSCOPY WITH PROPOFOL;  Surgeon: Rhonda Fair, MD;  Location: [facility];  Service: Endoscopy;  Laterality: N/A;  . EYE SURGERY  2011   bilateral cataract with lens implant  . HYSTEROSCOPY    . KNEE SURGERY  2018   arthroscopic for meniscal tear  . mohr's surgery     face  . TONSILLECTOMY    . TUBAL LIGATION     Social History   Socioeconomic History  . Marital status: Married    Spouse name: Not on file  . Number of children: Not on file  . Years of education: Not on file  . Highest education level: Not on file  Occupational History  . Not on file  Tobacco Use  . Smoking status: Former Smoker    Packs/day: 0.75    Years: 10.00    Pack years: 7.50    Types: Cigarettes    Quit date: 1972    Years since quitting: 49.6  . Smokeless tobacco: Never Used  . Tobacco comment: quit at age 40  Vaping Use  . Vaping Use: Never used  Substance and Sexual Activity  . Alcohol use: Yes    Alcohol/week: 7.0 standard drinks  Types: 7 Glasses of wine per week    Comment: one glass of wine a day  . Drug use: [name]  . Sexual activity: Not on file  Other Topics Concern  . Not on file  Social History Narrative   Married 1972. 1 living child daughter Rhonda [name]. lost son 60 years old to [place] in 2017. [name] grandkids. 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    Social Determinants of Health   Financial Resource Strain:   . Difficulty of Paying Living Expenses:   Food Insecurity:   . Worried About Charity fundraiser in the Last Year:   . Arboriculturist in the  Last Year:   Transportation Needs:   . Film/video editor (Medical):   Marland Kitchen Lack of Transportation (Non-Medical):   Physical Activity:   . Days of Exercise per Week:   . Minutes of Exercise per Session:   Stress:   . Feeling of Stress :   Social Connections:   . Frequency of Communication with Friends and Family:   . Frequency of Social Gatherings with Friends and Family:   . Attends Religious Services:   . Active Member of Clubs or Organizations:   . Attends Archivist Meetings:   Marland Kitchen Marital Status:    Allergies  Allergen Reactions  . Latex Rash   Family History  Problem Relation Age of Onset  . Alzheimer's disease Mother        died of bowel rupture  . Paget's disease of bone Mother   . Cancer Father        unknown  . CAD Son        63- died of MI  . Breast cancer Daughter 61    Current Outpatient Medications (Endocrine & Metabolic):  .  alendronate (FOSAMAX) 70 MG tablet, TAKE 1 TABLET BY MOUTH EVERY 7 (SEVEN) DAYS. TAKE WITH A FULL GLASS OF WATER ON AN EMPTY STOMACH.   Current Outpatient Medications (Respiratory):  .  fluticasone (FLONASE) 50 MCG/ACT nasal spray, Place 2 sprays into both nostrils as needed for allergies or rhinitis. Marland Kitchen  ipratropium (ATROVENT) 0.06 % nasal spray, Place 2 sprays into both nostrils 4 (four) times daily. Marland Kitchen  loratadine (CLARITIN) 10 MG tablet, Take 10 mg by mouth daily. .  montelukast (SINGULAIR) 10 MG tablet, Take one tablet once daily as directed    Current Outpatient Medications (Other):  .  calcium carbonate (OS-CAL) 600 MG TABS, Take 600 mg by mouth 2 (two) times daily with a meal.   .  cholecalciferol (VITAMIN D3) 25 MCG (1000 UT) tablet, Take 1,000 Units by mouth daily. .  fluocinonide (LIDEX) 0.05 % external solution, APPLY TOPICALLY 2 (TWO) TIMES DAILY. SCALP RASH .  Glucosamine-Chondroit-Vit C-Mn (GLUCOSAMINE CHONDR 1500 COMPLX) CAPS, Take 1 capsule by mouth at bedtime.  .  minoxidil (ROGAINE) 2 % external solution,  Apply 1 application topically at bedtime.    Reviewed prior external information including notes and imaging from  primary care provider As well as notes that were available from care everywhere and other healthcare systems.  Past medical history, social, surgical and family history all reviewed in electronic medical record.  [name] pertanent information unless stated regarding to the chief complaint.   Review of Systems:  [name] headache, visual changes, nausea, vomiting, diarrhea, constipation, dizziness, abdominal pain, skin rash, fevers, chills, night sweats,  weight loss, swollen lymph nodes, body aches, joint swelling, chest pain, shortness of breath, mood changes. POSITIVE muscle aches  Objective  Blood pressure 102/68, pulse 60, height 5\' 1"  (1.549 m), weight 116 lb (52.6 kg), SpO2 99 %.   General: [name] apparent distress alert and oriented x3 mood and affect normal, dressed appropriately.  HEENT: Pupils equal, extraocular movements intact  Respiratory: Patient's speak in full sentences and does not appear short of breath  Cardiovascular: [name] lower extremity edema, non tender, [name] erythema  Neuro: Cranial nerves II through XII are intact, neurovascularly intact in all extremities with 2+ DTRs and 2+ pulses.  Gait normal with good balance and coordination.  MSK:   Arthritic changes noted.  Patient's back still has the degenerative scoliosis.  Mild tightness but overall significant improvement from previous exam.  5-5 strength of the lower extremities.  Very comfortable sitting in chair today.    Impression and Recommendations:     The above documentation has been reviewed and is accurate and complete Lyndal Pulley, DO       Note: This dictation was prepared with Dragon dictation along with smaller phrase technology. Any transcriptional errors that result from this process are unintentional.

## 2020-06-25 NOTE — Patient Instructions (Signed)
See me when you need me 

## 2020-07-10 ENCOUNTER — Telehealth: Payer: Self-pay | Admitting: Family Medicine

## 2020-07-10 DIAGNOSIS — M810 Age-related osteoporosis without current pathological fracture: Secondary | ICD-10-CM

## 2020-07-10 DIAGNOSIS — Z0000 Encounter for general adult medical examination without abnormal findings: Secondary | ICD-10-CM

## 2020-07-10 DIAGNOSIS — E785 Hyperlipidemia, unspecified: Secondary | ICD-10-CM

## 2020-07-10 NOTE — Telephone Encounter (Signed)
Patient is requesting to come in a do her labwork Monday 8/16 before appt on Tuesday 8/17 due to her appt being in the afternoon and patient states she does not want to fast that long, can we order labs so we can schedule for Monday. Please advise  Thank you !

## 2020-07-11 NOTE — Telephone Encounter (Signed)
Can you call and make app for labs.

## 2020-07-11 NOTE — Telephone Encounter (Signed)
Labs ordered-you can set her up for a visit

## 2020-07-11 NOTE — Telephone Encounter (Signed)
Please advise if ok and what labs I will put order in and call for lab app

## 2020-07-14 ENCOUNTER — Other Ambulatory Visit: Payer: Medicare PPO

## 2020-07-14 ENCOUNTER — Other Ambulatory Visit: Payer: Self-pay

## 2020-07-14 DIAGNOSIS — Z0000 Encounter for general adult medical examination without abnormal findings: Secondary | ICD-10-CM | POA: Diagnosis not present

## 2020-07-14 DIAGNOSIS — M810 Age-related osteoporosis without current pathological fracture: Secondary | ICD-10-CM

## 2020-07-14 DIAGNOSIS — E785 Hyperlipidemia, unspecified: Secondary | ICD-10-CM | POA: Diagnosis not present

## 2020-07-14 LAB — COMPLETE METABOLIC PANEL WITH GFR
AG Ratio: 1.7 (calc) (ref 1.0–2.5)
ALT: 14 U/L (ref 6–29)
AST: 17 U/L (ref 10–35)
Albumin: 4.2 g/dL (ref 3.6–5.1)
Alkaline phosphatase (APISO): 67 U/L (ref 37–153)
BUN: 23 mg/dL (ref 7–25)
CO2: 30 mmol/L (ref 20–32)
Calcium: 9 mg/dL (ref 8.6–10.4)
Chloride: 105 mmol/L (ref 98–110)
Creat: 0.89 mg/dL (ref 0.60–0.93)
GFR, Est African American: 73 mL/min/{1.73_m2} (ref >=60)
GFR, Est Non African American: 63 mL/min/{1.73_m2} (ref >=60)
Globulin: 2.5 g/dL (calc) (ref 1.9–3.7)
Glucose, Bld: 94 mg/dL (ref 65–99)
Potassium: 4.5 mmol/L (ref 3.5–5.3)
Sodium: 140 mmol/L (ref 135–146)
Total Bilirubin: 0.6 mg/dL (ref 0.2–1.2)
Total Protein: 6.7 g/dL (ref 6.1–8.1)

## 2020-07-14 LAB — LIPID PANEL (REFL)
Cholesterol: 175 mg/dL (ref <200)
HDL: 54 mg/dL (ref >=50)
LDL Cholesterol (Calc): 100 mg/dL (calc) — ABNORMAL HIGH
Non-HDL Cholesterol (Calc): 121 mg/dL (calc) (ref <130)
Total CHOL/HDL Ratio: 3.2 (calc) (ref <5.0)
Triglycerides: 112 mg/dL (ref <150)

## 2020-07-14 LAB — CBC WITH DIFFERENTIAL/PLATELET
Absolute Monocytes: 270 cells/uL (ref 200–950)
Basophils Absolute: 31 cells/uL (ref 0–200)
Basophils Relative: 0.6 %
Eosinophils Absolute: 130 cells/uL (ref 15–500)
Eosinophils Relative: 2.5 %
HCT: 40.8 % (ref 35.0–45.0)
Hemoglobin: 13.9 g/dL (ref 11.7–15.5)
Lymphs Abs: 1716 cells/uL (ref 850–3900)
MCH: 32 pg (ref 27.0–33.0)
MCHC: 34.1 g/dL (ref 32.0–36.0)
MCV: 94 fL (ref 80.0–100.0)
MPV: 10.6 fL (ref 7.5–12.5)
Monocytes Relative: 5.2 %
Neutro Abs: 3052 cells/uL (ref 1500–7800)
Neutrophils Relative %: 58.7 %
Platelets: 227 10*3/uL (ref 140–400)
RBC: 4.34 10*6/uL (ref 3.80–5.10)
RDW: 12.2 % (ref 11.0–15.0)
Total Lymphocyte: 33 %
WBC: 5.2 10*3/uL (ref 3.8–10.8)

## 2020-07-14 LAB — VITAMIN D 25 HYDROXY (VIT D DEFICIENCY, FRACTURES): Vit D, 25-Hydroxy: 52 ng/mL (ref 30–100)

## 2020-07-14 NOTE — Telephone Encounter (Signed)
Pt scheduled  

## 2020-07-15 ENCOUNTER — Encounter: Payer: Self-pay | Admitting: Family Medicine

## 2020-07-15 ENCOUNTER — Ambulatory Visit (INDEPENDENT_AMBULATORY_CARE_PROVIDER_SITE_OTHER): Payer: Medicare PPO | Admitting: Family Medicine

## 2020-07-15 VITALS — BP 118/60 | HR 69 | Temp 98.3°F | Ht 61.0 in | Wt 115.0 lb

## 2020-07-15 DIAGNOSIS — Z0000 Encounter for general adult medical examination without abnormal findings: Secondary | ICD-10-CM | POA: Diagnosis not present

## 2020-07-15 DIAGNOSIS — M810 Age-related osteoporosis without current pathological fracture: Secondary | ICD-10-CM | POA: Diagnosis not present

## 2020-07-15 DIAGNOSIS — E785 Hyperlipidemia, unspecified: Secondary | ICD-10-CM

## 2020-07-15 NOTE — Progress Notes (Signed)
Phone [phone]    Subjective:  Patient presents today for their annual wellness visit (subsequent )  Preventive Screening-Counseling & Management  Modifiable Risk Factors/behavioral risk assessment/psychosocial risk assessment Regular exercise: 45 minutes daily Diet: healthy plant based  Wt Readings from Last 3 Encounters:  07/15/20 115 lb (52.2 kg)  06/25/20 116 lb (52.6 kg)  05/08/20 115 lb (52.2 kg)   Smoking Status: former Smoker but quit years ago- no regular screening Second Hand Smoking status: No smokers in home Alcohol intake: 7 per week  Cardiac risk factors:  advanced age (older than 48 for men, 63 for women)  untreated Hyperlipidemia  no Hypertension  No diabetes.  No results found for: HGBA1C Family History:  CAD in son.   Family History  Problem Relation Age of Onset  . Alzheimer's disease Mother        died of bowel rupture  . Paget's disease of bone Mother   . Cancer Father        unknown  . CAD Son        9- died of MI  . Breast cancer Daughter 80  coronary calcium scoring of 0   Depression Screen/risk evaluation Risk factors: daughter with cancer but doing well.Marland Kitchen PHQ2 0  Depression screen [facility] 2/9 07/15/2020 09/04/2019 07/11/2019 01/11/2019 12/01/2017  Decreased Interest 0 0 1 2 0  Down, Depressed, Hopeless 0 0 1 2 0  PHQ - 2 Score 0 0 2 4 0  Altered sleeping 0 - 0 3 -  Tired, decreased energy 0 0 1 1 -  Change in appetite 0 0 0 2 -  Feeling bad or failure about yourself  0 0 0 1 -  Trouble concentrating 0 0 0 1 -  Moving slowly or fidgety/restless 0 0 0 0 -  Suicidal thoughts 0 0 0 0 -  PHQ-9 Score 0 - 3 12 -  Difficult doing work/chores Not difficult at all Not difficult at all Somewhat difficult - -    Functional ability and level of safety Mobility assessment:  timed get up and go <12 seconds Activities of Daily Living- Independent in ADLs (toileting, bathing, dressing, transferring, eating) and in IADLs (shopping, housekeeping, managing  own medications, and handling finances) Home Safety: Loose rugs (no), smoke detectors (up to date), small pets (no), grab bars (no-could consider installation), stairs (elevator in home can use if needed- has 4 floors- uses elevator as helps hips- may go up 5 x on stairs and ride othertimes but no major issues with stairs), life-alert system (would use cell phone) Hearing Difficulties: -patient endorses but already has hearing aids Fall Risk: None  Fall Risk  07/15/2020 12/01/2017 12/03/2016 05/19/2016 05/13/2015  Falls in the past year? 0 Yes No No No  Number falls in past yr: 0 1 - - -  Injury with Fall? 0 - - - -  Risk for fall due to : - Impaired balance/gait - - -  Follow up - Education provided - - -  Opioid use history:  no long term opioids use Self assessment of health status: fantastic  Cognitive Testing             No reported trouble. But worries due to family history  Mini cog: normal clock draw. 2/3 delayed recall. Normal test result   List the Names of Other Physician/Practitioners you currently use: Patient Care Team: Marin Olp, MD as PCP - General (Family Medicine) Buford Dresser, MD as PCP - Cardiology (Cardiology) Martinique, Amy, MD  as Consulting Physician (Dermatology) -Dr. Paula Compton GYN  Required Immunizations needed today:  Recommend fall flu shot Immunization History  Administered Date(s) Administered  . Fluad Quad(high Dose 65+) 08/08/2019  . Influenza Split 10/04/2011, 09/12/2012  . Influenza Whole 10/22/2009, 09/03/2010  . Influenza, High Dose Seasonal PF 09/05/2013, 08/29/2014, 09/12/2015, 09/06/2016, 09/14/2017, 09/06/2018  . Influenza,inj,Quad PF,6+ Mos 09/06/2014  . PFIZER SARS-COV-2 Vaccination 12/18/2019, 01/08/2020  . Pneumococcal Conjugate-13 09/06/2014  . Pneumococcal Polysaccharide-23 10/22/2009  . Td 09/25/2008  . Tdap 01/12/2019  . Zoster Recombinat (Shingrix) 03/02/2018, 05/15/2018   Health Maintenance  Topic Date Due  . Flu  Shot  06/29/2020  . Colon Cancer Screening  07/20/2021  . Tetanus Vaccine  01/12/2029  . DEXA scan (bone density measurement)  Completed  . COVID-19 Vaccine  Completed  .  Hepatitis C: One time screening is recommended by Center for Disease Control  (CDC) for  adults born from [birth year] through 1967.   Completed  . Pneumonia vaccines  Completed    Screening tests-  Health Maintenance Due  Topic Date Due  . INFLUENZA VACCINE  06/29/2020   1. Colon cancer screening- 2017 with 5 year repeat with [facility] 2. Lung Cancer screening- not a candidate 3. Skin cancer screening- sees derm yearly 4. Cervical cancer screening- past age based screening requirements 5. Breast cancer screening- up to date on mammogram had June 2021   The following were reviewed and entered/updated in epic if appropriate: Past Medical History:  Diagnosis Date  . Allergy   . Arthritis   . Hyperlipidemia   . Neck pain, chronic   . 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 Rosacea    metrogel in past   Patient Active Problem List   Diagnosis Date Noted  . Idiopathic urticaria 06/16/2017    Priority: Medium  . Hyperlipidemia 09/05/2006    Priority: Medium  . Rash 07/11/2019    Priority: Low  . Osteoporosis 11/15/2014    Priority: Low  . Allergic rhinitis 11/15/2014    Priority: Low  . History of basal cell cancer 11/15/2014    Priority: Low  . Rosacea     Priority: Low  . Osteoarthritis 11/18/2011    Priority: Low  . Hematuria 09/25/2008    Priority: Low  . Right-sided back pain 05/08/2020  . Dysuria 01/12/2019  . Prolapse of female genital organs 01/12/2019  . Ocular migraine 02/16/2018  . Bilateral shoulder pain 11/15/2014  . Posterior capsular opacification, left 10/18/2013  . Viral conjunctivitis 09/06/2012  . Pseudophakia 08/25/2012  . Choroidal nevus of right eye 10/15/2011  . Posterior vitreous detachment of both eyes 10/15/2011  . Status post intraocular lens  implant 10/15/2011   Past Surgical History:  Procedure Laterality Date  . bcc excision     x3  . CHOLECYSTECTOMY    . COLONOSCOPY WITH PROPOFOL N/A 07/20/2016   Procedure: COLONOSCOPY WITH PROPOFOL;  Surgeon: Garlan Fair, MD;  Location: [facility];  Service: Endoscopy;  Laterality: N/A;  . EYE SURGERY  2011   bilateral cataract with lens implant  . HYSTEROSCOPY    . KNEE SURGERY  2018   arthroscopic for meniscal tear  . mohr's surgery     face  . TONSILLECTOMY    . TUBAL LIGATION      Family History  Problem Relation Age of Onset  . Alzheimer's disease Mother        died of bowel rupture  . Paget's disease of bone  Mother   . Cancer Father        unknown  . CAD Son        59- died of MI  . Breast cancer Daughter 46    Medications- reviewed and updated Current Outpatient Medications  Medication Sig Dispense Refill  . alendronate (FOSAMAX) 70 MG tablet TAKE 1 TABLET BY MOUTH EVERY 7 (SEVEN) DAYS. TAKE WITH A FULL GLASS OF WATER ON AN EMPTY STOMACH. 12 tablet 3  . calcium carbonate (OS-CAL) 600 MG TABS Take 600 mg by mouth 2 (two) times daily with a meal.      . cholecalciferol (VITAMIN D3) 25 MCG (1000 UT) tablet Take 1,000 Units by mouth daily.    . fluocinonide (LIDEX) 0.05 % external solution APPLY TOPICALLY 2 (TWO) TIMES DAILY. SCALP RASH 60 mL 1  . fluticasone (FLONASE) 50 MCG/ACT nasal spray Place 2 sprays into both nostrils as needed for allergies or rhinitis.    . Glucosamine-Chondroit-Vit C-Mn (GLUCOSAMINE CHONDR 1500 COMPLX) CAPS Take 1 capsule by mouth at bedtime.     Marland Kitchen ipratropium (ATROVENT) 0.06 % nasal spray Place 2 sprays into both nostrils 4 (four) times daily. 15 mL 5  . loratadine (CLARITIN) 10 MG tablet Take 10 mg by mouth daily.    . minoxidil (ROGAINE) 2 % external solution Apply 1 application topically at bedtime.     . montelukast (SINGULAIR) 10 MG tablet Take one tablet once daily as directed 30 tablet 11   No current facility-administered  medications for this visit.    Allergies-reviewed and updated Allergies  Allergen Reactions  . Latex Rash    Social History   Socioeconomic History  . Marital status: Married    Spouse name: Not on file  . Number of children: Not on file  . Years of education: Not on file  . Highest education level: Not on file  Occupational History  . Not on file  Tobacco Use  . Smoking status: Former Smoker    Packs/day: 0.75    Years: 10.00    Pack years: 7.50    Types: Cigarettes    Quit date: 1972    Years since quitting: 49.6  . Smokeless tobacco: Never Used  . Tobacco comment: quit at age 22  Vaping Use  . Vaping Use: Never used  Substance and Sexual Activity  . Alcohol use: Yes    Alcohol/week: 7.0 standard drinks    Types: 7 Glasses of wine per week    Comment: one glass of wine a day  . Drug use: No  . Sexual activity: Not on file  Other Topics Concern  . Not on file  Social History Narrative   Married 1972. 1 living child daughter [name]. lost son 76 years old to [place] in 2017. No grandkids. 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    Social Determinants of Health   Financial Resource Strain:   . Difficulty of Paying Living Expenses:   Food Insecurity:   . Worried About Charity fundraiser in the Last Year:   . Arboriculturist in the Last Year:   Transportation Needs:   . Film/video editor (Medical):   Marland Kitchen Lack of Transportation (Non-Medical):   Physical Activity:   . Days of Exercise per Week:   . Minutes of Exercise per Session:   Stress:   . Feeling of  Stress :   Social Connections:   . Frequency of Communication with Friends and Family:   . Frequency of Social Gatherings with Friends and Family:   . Attends Religious Services:   . Active Member of Clubs or Organizations:   . Attends Archivist Meetings:   Marland Kitchen Marital Status:        Objective:  BP 118/60   Pulse 69   Temp 98.3 F (36.8 C) (Temporal)   Ht 5\' 1"  (1.549 m)   Wt 115 lb (52.2 kg)   SpO2 97%   BMI 21.73 kg/m  Gen: NAD, resting comfortably   Assessment/Plan:  AWV completed 1. Educated, counseled and referred based on above elements 2. Educated, counseled and referred as appropriate for preventative needs 3. Discussed and documented a written plan for preventiative services and screenings with personalized health advice- After Visit Summary was given to patient which included this plan   Status of chronic or acute concerns  See physical template   Recommended follow up: 1 year awv Future Appointments  Date Time Provider [place]  09/16/2020 10:30 AM Kozlow, Donnamarie Poag, MD [facility] None     Lab/Order associations:   ICD-10-CM   1. Preventative health care  Z00.00    Return precautions advised.  Garret Reddish, MD

## 2020-07-15 NOTE — Patient Instructions (Addendum)
Health Maintenance Due  Topic Date Due  . INFLUENZA VACCINE - - will complete later in flu season (please let us know if you get this at another location so we can update your chart) . We should have vaccination here in 1-2 months - can call back for an appointment.   06/29/2020    [name] , Thank you for taking time to come for your Medicare Wellness Visit. I appreciate your ongoing commitment to your health goals. Please review the following plan we discussed and let me know if I can assist you in the future.   These are the goals we discussed: Goals    . Exercise 150 min/wk Moderate Activity- keep up the great job!      Will continue progress with your exercise        This is a list of the screening recommended for you and due dates:  Health Maintenance  Topic Date Due  . Flu Shot  06/29/2020  . Colon Cancer Screening  07/20/2021  . Tetanus Vaccine  01/12/2029  . DEXA scan (bone density measurement)  Completed  . COVID-19 Vaccine  Completed  .  Hepatitis C: One time screening is recommended by Center for Disease Control  (CDC) for  adults born from [birth year] through 1967.   Completed  . Pneumonia vaccines  Completed   Recommended follow up: Return in about 1 year (around 07/15/2021) for physical or sooner if needed.

## 2020-07-15 NOTE — Progress Notes (Signed)
Phone [phone]   Subjective:  Patient presents today for their annual physical. Chief complaint-noted.   See problem oriented charting- Review of Systems  Constitutional: Negative for chills and fever.  HENT: Negative for ear pain, hearing loss and tinnitus.   Eyes: Negative for blurred vision, double vision and photophobia.  Respiratory: Negative for cough, shortness of breath and wheezing.   Cardiovascular: Negative for chest pain, palpitations and leg swelling.  Gastrointestinal: Negative for heartburn, nausea and vomiting.  Genitourinary: Negative for dysuria, frequency and urgency.  Musculoskeletal: Negative for back pain, joint pain and neck pain.  Skin: Negative for rash.  Neurological: Negative for dizziness, seizures, weakness and headaches.  Endo/Heme/Allergies: Does not bruise/bleed easily.  Psychiatric/Behavioral: Negative for depression, memory loss and suicidal ideas. The patient does not have insomnia.     The following were reviewed and entered/updated in epic: Past Medical History:  Diagnosis Date  . Allergy   . Arthritis   . Hyperlipidemia   . Neck pain, chronic   . 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 Rosacea    metrogel in past   Patient Active Problem List   Diagnosis Date Noted  . Idiopathic urticaria 06/16/2017    Priority: Medium  . Hyperlipidemia 09/05/2006    Priority: Medium  . Rash 07/11/2019    Priority: Low  . Osteoporosis 11/15/2014    Priority: Low  . Allergic rhinitis 11/15/2014    Priority: Low  . History of basal cell cancer 11/15/2014    Priority: Low  . Rosacea     Priority: Low  . Osteoarthritis 11/18/2011    Priority: Low  . Hematuria 09/25/2008    Priority: Low  . Right-sided back pain 05/08/2020  . Dysuria 01/12/2019  . Prolapse of female genital organs 01/12/2019  . Ocular migraine 02/16/2018  . Bilateral shoulder pain 11/15/2014  . Posterior capsular opacification, left  10/18/2013  . Viral conjunctivitis 09/06/2012  . Pseudophakia 08/25/2012  . Choroidal nevus of right eye 10/15/2011  . Posterior vitreous detachment of both eyes 10/15/2011  . Status post intraocular lens implant 10/15/2011   Past Surgical History:  Procedure Laterality Date  . bcc excision     x3  . CHOLECYSTECTOMY    . COLONOSCOPY WITH PROPOFOL N/A 07/20/2016   Procedure: COLONOSCOPY WITH PROPOFOL;  Surgeon: Garlan Fair, MD;  Location: [facility];  Service: Endoscopy;  Laterality: N/A;  . EYE SURGERY  2011   bilateral cataract with lens implant  . HYSTEROSCOPY    . KNEE SURGERY  2018   arthroscopic for meniscal tear  . mohr's surgery     face  . TONSILLECTOMY    . TUBAL LIGATION      Family History  Problem Relation Age of Onset  . Alzheimer's disease Mother        died of bowel rupture  . Paget's disease of bone Mother   . Cancer Father        unknown  . CAD Son        41- died of MI  . Breast cancer Daughter 69    Medications- reviewed and updated Current Outpatient Medications  Medication Sig Dispense Refill  . alendronate (FOSAMAX) 70 MG tablet TAKE 1 TABLET BY MOUTH EVERY 7 (SEVEN) DAYS. TAKE WITH A FULL GLASS OF WATER ON AN EMPTY STOMACH. 12 tablet 3  . calcium carbonate (OS-CAL) 600 MG TABS Take 600 mg by mouth 2 (two) times daily with a meal.      .  cholecalciferol (VITAMIN D3) 25 MCG (1000 UT) tablet Take 1,000 Units by mouth daily.    . fluocinonide (LIDEX) 0.05 % external solution APPLY TOPICALLY 2 (TWO) TIMES DAILY. SCALP RASH 60 mL 1  . fluticasone (FLONASE) 50 MCG/ACT nasal spray Place 2 sprays into both nostrils as needed for allergies or rhinitis.    . Glucosamine-Chondroit-Vit C-Mn (GLUCOSAMINE CHONDR 1500 COMPLX) CAPS Take 1 capsule by mouth at bedtime.     Marland Kitchen ipratropium (ATROVENT) 0.06 % nasal spray Place 2 sprays into both nostrils 4 (four) times daily. 15 mL 5  . loratadine (CLARITIN) 10 MG tablet Take 10 mg by mouth daily.    . minoxidil  (ROGAINE) 2 % external solution Apply 1 application topically at bedtime.     . montelukast (SINGULAIR) 10 MG tablet Take one tablet once daily as directed 30 tablet 11   No current facility-administered medications for this visit.    Allergies-reviewed and updated Allergies  Allergen Reactions  . Latex Rash    Social History   Social History Narrative   Married 1972. 1 living child daughter [name]. lost son 79 years old to [place] in 2017. No grandkids. 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    Objective  Objective:  BP 118/60   Pulse 69   Temp 98.3 F (36.8 C) (Temporal)   Ht 5\' 1"  (1.549 m)   Wt 115 lb (52.2 kg)   SpO2 97%   BMI 21.73 kg/m  Gen: NAD, resting comfortably HEENT: Mucous membranes are moist. Oropharynx normal Neck: no thyromegaly CV: RRR no murmurs rubs or gallops Lungs: CTAB no crackles, wheeze, rhonchi Abdomen: soft/nontender/nondistended/normal bowel sounds. No rebound or guarding.  Ext: no edema Skin: warm, dry Neuro: grossly normal, moves all extremities, PERRLA   Assessment and Plan   74 y.o. female presenting for annual physical.  Health Maintenance counseling: 1. Anticipatory guidance: Patient counseled regarding regular dental exams q6 months, eye exams -yearly but due for update ,  avoiding smoking and second hand smoke , limiting alcohol to 1 beverage per day- . Drinks a glass of wine a day.   2. Risk factor reduction:  Advised patient of need for regular exercise and diet rich and fruits and vegetables to reduce risk of heart attack and stroke. Exercise- -walks 45 min daily . Diet- limits meat and trying to have plant based diet.  Do not want patient to lose weight Wt Readings from Last 3 Encounters:  07/15/20 115 lb (52.2 kg)  06/25/20 116 lb (52.6 kg)  05/08/20 115 lb (52.2 kg)  3. Immunizations/screenings/ancillary  studies-recommended flu shot in the fall  Immunization History  Administered Date(s) Administered  . Fluad Quad(high Dose 65+) 08/08/2019  . Influenza Split 10/04/2011, 09/12/2012  . Influenza Whole 10/22/2009, 09/03/2010  . Influenza, High Dose Seasonal PF 09/05/2013, 08/29/2014, 09/12/2015, 09/06/2016, 09/14/2017, 09/06/2018  . Influenza,inj,Quad PF,6+ Mos 09/06/2014  . PFIZER SARS-COV-2 Vaccination 12/18/2019, 01/08/2020  . Pneumococcal Conjugate-13 09/06/2014  . Pneumococcal Polysaccharide-23 10/22/2009  . Td 09/25/2008  . Tdap 01/12/2019  . Zoster Recombinat (Shingrix) 03/02/2018, 05/15/2018  4. Cervical cancer screening- still follows with gynecology but past age based screening recommendations 5. Breast cancer screening-  breast exam with gynecology and mammogram last one 04/29/2020 6. Colon cancer screening -July 20, 2016 with 5-year repeat planned due to adenoma with [facility] Dr. Hassell Done.  Up to date  32. Skin  cancer screening- advised regular sunscreen use. Denies worrisome, changing, or new skin lesions. Followed by Dr. Martinique recently. Declines any areas of concern.  8. Birth control/STD check- monogamous/postmenopausal 9. Osteoporosis screening at 6-  last done 07/11/2020-see osteoporosis discussion below -former smoker 40years ago only when in college 1-2 ppd.  No regular screening required  Status of chronic or acute concerns   #Social update-patient's daughter with breast cancer - seems to be recovering well- orginally noted 2020.   #Osteoporosis-patient on Fosamax due to elevated FRAX score.  Updated bone density last year.  Continue calcium and vitamin D. Fosamax started 2017- consider 5 year break starting next physical. Last dexa 06/2019- if bone density statble next year consider 5 year pause.   #Allergies-on Claritin and Singulair.  Also using Flonase  #Mild hearing loss-doing okay with hearing aids  #hyperlipidemia S: Medication:None  Lab Results  Component Value  Date   CHOL 175 07/14/2020   HDL 54 07/14/2020   LDLCALC 100 (H) 07/14/2020   LDLDIRECT 126.4 11/18/2011   TRIG 112 07/14/2020   CHOLHDL 3.2 07/14/2020   A/P: mild poor control- continue to work on healthy diet.  10-year ASCVD risk score 15.3% patient with coronary calcium score of 0 March 2020 so we have opted to remain off statin    #Idiopathic urticaria-doing well on Singulair and Claritin once a day-seasonal  #Bursitis issues last year-doing better this year  #Neck stiffness-does better with water exercises- holding off but thankfully has not flared. Knees slightly worse without water aerobics   cold senstivity- consider TSH next labs  # vitamin D deficiency- increased to 3800 units recently. Vitamin d looked good on labs.   Recommended follow up: Return in about 1 year (around 07/15/2021) for physical or sooner if needed. Future Appointments  Date Time Provider [place]  09/16/2020 10:30 AM Kozlow, Donnamarie Poag, MD [facility] None   Lab/Order associations: already had labs   ICD-10-CM   1. Preventative health care  Z00.00   2. Age-related osteoporosis without current pathological fracture  M81.0     No orders of the defined types were placed in this encounter.   Return precautions advised.  Garret Reddish, MD

## 2020-08-20 ENCOUNTER — Ambulatory Visit (INDEPENDENT_AMBULATORY_CARE_PROVIDER_SITE_OTHER): Payer: Medicare PPO

## 2020-08-20 ENCOUNTER — Encounter: Payer: Self-pay | Admitting: Family Medicine

## 2020-08-20 ENCOUNTER — Other Ambulatory Visit: Payer: Self-pay

## 2020-08-20 DIAGNOSIS — Z23 Encounter for immunization: Secondary | ICD-10-CM

## 2020-09-11 DIAGNOSIS — Z961 Presence of intraocular lens: Secondary | ICD-10-CM | POA: Diagnosis not present

## 2020-09-11 DIAGNOSIS — H43813 Vitreous degeneration, bilateral: Secondary | ICD-10-CM | POA: Diagnosis not present

## 2020-09-11 DIAGNOSIS — D3131 Benign neoplasm of right choroid: Secondary | ICD-10-CM | POA: Diagnosis not present

## 2020-09-14 ENCOUNTER — Other Ambulatory Visit: Payer: Self-pay | Admitting: Allergy and Immunology

## 2020-09-16 ENCOUNTER — Encounter: Payer: Self-pay | Admitting: Allergy and Immunology

## 2020-09-16 ENCOUNTER — Ambulatory Visit: Payer: Medicare PPO | Admitting: Allergy and Immunology

## 2020-09-16 ENCOUNTER — Other Ambulatory Visit: Payer: Self-pay

## 2020-09-16 VITALS — BP 108/58 | HR 64 | Temp 98.4°F | Resp 14 | Ht 61.0 in | Wt 116.2 lb

## 2020-09-16 DIAGNOSIS — J3089 Other allergic rhinitis: Secondary | ICD-10-CM

## 2020-09-16 DIAGNOSIS — H04123 Dry eye syndrome of bilateral lacrimal glands: Secondary | ICD-10-CM | POA: Diagnosis not present

## 2020-09-16 MED ORDER — MONTELUKAST SODIUM 10 MG PO TABS: 10.0000 mg | ORAL_TABLET | Freq: Every day | ORAL | 5 refills | Status: DC

## 2020-09-16 MED ORDER — FLUTICASONE PROPIONATE 50 MCG/ACT NA SUSP: 2.0000 | NASAL | 5 refills | Status: DC | PRN

## 2020-09-16 NOTE — Patient Instructions (Signed)
  1. DISCONTINUE Zyrtec - does this help your eyes?   2. Continue montelukast 10 mg one tablet one time per day   3. Can use nasal ipratropium 0.06%- 2 sprays each nostril every 6 hours if needed to dry up nose  4. Hold off on restarting Flonase  5. Visit with Eye doctor to examine for possible dry eye  6. Return to clinic in 12 months or earlier if problem

## 2020-09-16 NOTE — Progress Notes (Signed)
[facility]   Follow-up Note  Referring Provider: Marin Olp, MD Primary Provider: Marin Olp, MD Date of Office Visit: 09/16/2020  Subjective:   Rhonda [name] (DOB: 1946-09-22) is a 74 y.o. female who returns to the [facility] [place] on 09/16/2020 in re-evaluation of the following:  HPI: Rhonda [name] returns to this clinic in evaluation of allergic rhinitis and history of inflammatory dermatosis.  Her last visit to this clinic was 09/11/2019.  Overall she has really done well.  She did develop a little bit of irritation within her nasal airway when using Flonase and nasal ipratropium and she has recently discontinued these agents.  She definitely notes that the use of nasal ipratropium has resulted in very good control of her chronic rhinorrhea.  She continues on montelukast as well.  She has not required a systemic steroid or antibiotic for any type of airway issue.  She does complain of having constant watery eyes.  She cannot remember the last time that she had an ophthalmology evaluation and she cannot remember being told that she has had dry eye syndrome in the past.  Allergies as of 09/16/2020      Reactions   Latex Rash      Medication List      alendronate 70 MG tablet Commonly known as: FOSAMAX TAKE 1 TABLET BY MOUTH EVERY 7 (SEVEN) DAYS. TAKE WITH A FULL GLASS OF WATER ON AN EMPTY STOMACH.   calcium carbonate 600 MG Tabs tablet Commonly known as: OS-CAL Take 600 mg by mouth 2 (two) times daily with a meal.   cholecalciferol 25 MCG (1000 UNIT) tablet Commonly known as: VITAMIN D3 Take 1,000 Units by mouth daily.   fluocinonide 0.05 % external solution Commonly known as: LIDEX APPLY TOPICALLY 2 (TWO) TIMES DAILY. SCALP RASH   fluticasone 50 MCG/ACT nasal spray Commonly known as: FLONASE Place 2 sprays into both nostrils as needed for allergies or rhinitis.   Glucosamine Chondr 1500 Complx  Caps Take 1 capsule by mouth at bedtime.   ipratropium 0.06 % nasal spray Commonly known as: ATROVENT Place 2 sprays into both nostrils 4 (four) times daily.   loratadine 10 MG tablet Commonly known as: CLARITIN Take 10 mg by mouth daily.   minoxidil 2 % external solution Commonly known as: ROGAINE Apply 1 application topically at bedtime.   montelukast 10 MG tablet Commonly known as: SINGULAIR Take 1 tablet (10 mg total) by mouth daily.       Past Medical History:  Diagnosis Date  . Allergy   . Arthritis   . Hyperlipidemia   . Neck pain, chronic   . 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 Rosacea    metrogel in past    Past Surgical History:  Procedure Laterality Date  . bcc excision     x3  . CHOLECYSTECTOMY    . COLONOSCOPY WITH PROPOFOL N/A 07/20/2016   Procedure: COLONOSCOPY WITH PROPOFOL;  Surgeon: Garlan Fair, MD;  Location: [facility];  Service: Endoscopy;  Laterality: N/A;  . EYE SURGERY  2011   bilateral cataract with lens implant  . HYSTEROSCOPY    . KNEE SURGERY  2018   arthroscopic for meniscal tear  . mohr's surgery     face  . TONSILLECTOMY    . TUBAL LIGATION      Review of systems negative except as noted in HPI / PMHx or  noted below:  Review of Systems  Constitutional: Negative.   HENT: Negative.   Eyes: Negative.   Respiratory: Negative.   Cardiovascular: Negative.   Gastrointestinal: Negative.   Genitourinary: Negative.   Musculoskeletal: Negative.   Skin: Negative.   Neurological: Negative.   Endo/Heme/Allergies: Negative.   Psychiatric/Behavioral: Negative.      Objective:   Vitals:   09/16/20 1027  BP: (!) 108/58  Pulse: 64  Resp: 14  Temp: 98.4 F (36.9 C)  SpO2: 100%   Height: 5\' 1"  (154.9 cm)  Weight: 116 lb 3.2 oz (52.7 kg)   Physical Exam Constitutional:      Appearance: She is not diaphoretic.  HENT:     Head: Normocephalic.     Right Ear: Tympanic membrane, ear  canal and external ear normal.     Left Ear: Tympanic membrane, ear canal and external ear normal.     Nose: Nose normal. No mucosal edema or rhinorrhea.     Mouth/Throat:     Pharynx: Uvula midline. No oropharyngeal exudate.  Eyes:     Conjunctiva/sclera: Conjunctivae normal.  Neck:     Thyroid: No thyromegaly.     Trachea: Trachea normal. No tracheal tenderness or tracheal deviation.  Cardiovascular:     Rate and Rhythm: Normal rate and regular rhythm.     Heart sounds: Normal heart sounds, S1 normal and S2 normal. No murmur heard.   Pulmonary:     Effort: No respiratory distress.     Breath sounds: Normal breath sounds. No stridor. No wheezing or rales.  Lymphadenopathy:     Head:     Right side of head: No tonsillar adenopathy.     Left side of head: No tonsillar adenopathy.     Cervical: No cervical adenopathy.  Skin:    Findings: No erythema or rash.     Nails: There is no clubbing.  Neurological:     Mental Status: She is alert.     Diagnostics: none  Assessment and Plan:   1. Other allergic rhinitis   2. Dry eye syndrome of both eyes     1. DISCONTINUE Zyrtec - does this help your eyes?   2. Continue montelukast 10 mg one tablet one time per day   3. Can use nasal ipratropium 0.06%- 2 sprays each nostril every 6 hours if needed to dry up nose  4. Hold off on restarting Flonase  5. Visit with Eye doctor to examine for possible dry eye  6. Return to clinic in 12 months or earlier if problem  Overall Rhonda [name] is doing relatively well.  We will keep her on a leukotriene modifier and she has the option of using nasal ipratropium.  Because she did develop a sore in her nose with Flonase we will hold off on restarting that medication.  She has constant watery eyes and I wonder if she does not have some defect in her tear shield and I am going to have her discontinue her Zyrtec for a few weeks to see if this actually helps her eyes and I have asked her to follow-up with  a eye doctor concerning possible evaluation for dry eye syndrome.  We will see how things go with this approach over the next month or so.  Rhonda Katz, MD Allergy / Immunology [place]

## 2020-09-17 ENCOUNTER — Encounter: Payer: Self-pay | Admitting: Allergy and Immunology

## 2021-01-28 ENCOUNTER — Other Ambulatory Visit: Payer: Self-pay

## 2021-01-28 ENCOUNTER — Ambulatory Visit (INDEPENDENT_AMBULATORY_CARE_PROVIDER_SITE_OTHER): Payer: Medicare PPO | Admitting: Allergy and Immunology

## 2021-01-28 ENCOUNTER — Encounter: Payer: Self-pay | Admitting: Allergy and Immunology

## 2021-01-28 VITALS — BP 110/64 | HR 80 | Resp 16

## 2021-01-28 DIAGNOSIS — H04123 Dry eye syndrome of bilateral lacrimal glands: Secondary | ICD-10-CM

## 2021-01-28 DIAGNOSIS — J3089 Other allergic rhinitis: Secondary | ICD-10-CM | POA: Diagnosis not present

## 2021-01-28 DIAGNOSIS — H6983 Other specified disorders of Eustachian tube, bilateral: Secondary | ICD-10-CM | POA: Diagnosis not present

## 2021-01-28 NOTE — Progress Notes (Signed)
[facility]   Follow-up Note  Referring Provider: Marin Olp, MD Primary Provider: Marin Olp, MD Date of Office Visit: 01/28/2021  Subjective:   Emonnie Cannady (DOB: December 29, 1945) is a 75 y.o. female who returns to the [facility] [place] on 01/28/2021 in re-evaluation of the following:  HPI: Coren returns to this clinic in reevaluation of allergic rhinitis and dry eye syndrome.  I last saw her in this clinic on 16 September 2020.  The big issue for Sharina at this point in time is that over the course of the past 2 to 3 weeks she has been having hearing loss.  She saw her audiologist who examined her ears and told her that she had "fluid in her ear".  She does not have any associated tinnitus or vertigo.  Her hearing aids just are not working very well.  Her nasal issues is actually going pretty well at this point although she does have a little bit of nasal congestion.  She has been without any Flonase or ipratropium because she thought that both of those agents may have irritated her nose.  She continues to use montelukast.  She has not required a systemic steroid or an antibiotic for any type of airway issue since we have seen her in this clinic last.  Her dry eye syndrome is going quite well without any significant problems.  She does remain away from using antihistamines.  She has received 3 Covid vaccines and a flu vaccine.  Allergies as of 01/28/2021      Reactions   Latex Rash      Medication List    alendronate 70 MG tablet Commonly known as: FOSAMAX TAKE 1 TABLET BY MOUTH EVERY 7 (SEVEN) DAYS. TAKE WITH A FULL GLASS OF WATER ON AN EMPTY STOMACH.   calcium carbonate 600 MG Tabs tablet Commonly known as: OS-CAL Take 600 mg by mouth 2 (two) times daily with a meal.   cholecalciferol 25 MCG (1000 UNIT) tablet Commonly known as: VITAMIN D3 Take 1,000 Units by mouth daily.   Glucosamine Chondr 1500  Complx Caps Take 1 capsule by mouth at bedtime.   ipratropium 0.06 % nasal spray Commonly known as: ATROVENT Place 2 sprays into both nostrils 4 (four) times daily.   minoxidil 2 % external solution Commonly known as: ROGAINE Apply 1 application topically at bedtime.   montelukast 10 MG tablet Commonly known as: SINGULAIR Take 1 tablet (10 mg total) by mouth daily.       Past Medical History:  Diagnosis Date  . Allergy   . Arthritis   . Hyperlipidemia   . Neck pain, chronic   . 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 Rosacea    metrogel in past    Past Surgical History:  Procedure Laterality Date  . bcc excision     x3  . CHOLECYSTECTOMY    . COLONOSCOPY WITH PROPOFOL N/A 07/20/2016   Procedure: COLONOSCOPY WITH PROPOFOL;  Surgeon: Garlan Fair, MD;  Location: [facility];  Service: Endoscopy;  Laterality: N/A;  . EYE SURGERY  2011   bilateral cataract with lens implant  . HYSTEROSCOPY    . KNEE SURGERY  2018   arthroscopic for meniscal tear  . mohr's surgery     face  . TONSILLECTOMY    . TUBAL LIGATION      Review of systems negative except as noted in  HPI / PMHx or noted below:  Review of Systems  Constitutional: Negative.   HENT: Negative.   Eyes: Negative.   Respiratory: Negative.   Cardiovascular: Negative.   Gastrointestinal: Negative.   Genitourinary: Negative.   Musculoskeletal: Negative.   Skin: Negative.   Neurological: Negative.   Endo/Heme/Allergies: Negative.   Psychiatric/Behavioral: Negative.      Objective:   Vitals:   01/28/21 1340  BP: 110/64  Pulse: 80  Resp: 16  SpO2: 99%          Physical Exam Constitutional:      Appearance: She is not diaphoretic.  HENT:     Head: Normocephalic.     Right Ear: Ear canal and external ear normal. A middle ear effusion (Dull light reflex.  No pneumatic movement) is present.     Left Ear: Ear canal and external ear normal. A middle ear effusion  (Dull light reflex.  No pneumatic movement) is present.     Nose: Nose normal. No mucosal edema or rhinorrhea.     Mouth/Throat:     Mouth: Oropharynx is clear and moist and mucous membranes are normal.     Pharynx: Uvula midline. No oropharyngeal exudate.  Eyes:     Conjunctiva/sclera: Conjunctivae normal.  Neck:     Thyroid: No thyromegaly.     Trachea: Trachea normal. No tracheal tenderness or tracheal deviation.  Cardiovascular:     Rate and Rhythm: Normal rate and regular rhythm.     Heart sounds: Normal heart sounds, S1 normal and S2 normal. No murmur heard.   Pulmonary:     Effort: No respiratory distress.     Breath sounds: Normal breath sounds. No stridor. No wheezing or rales.  Musculoskeletal:        General: No edema.  Lymphadenopathy:     Head:     Right side of head: No tonsillar adenopathy.     Left side of head: No tonsillar adenopathy.     Cervical: No cervical adenopathy.  Skin:    Findings: No erythema or rash.     Nails: There is no clubbing.  Neurological:     Mental Status: She is alert.     Diagnostics: none  Assessment and Plan:   1. Dysfunction of both eustachian tubes   2. Other allergic rhinitis   3. Dry eye syndrome of both eyes     1. Start OTC Nasacort - 1 spray each nostril 3 times per week. Do not spray into midline septum.   2. Start prednisone 10 mg - 1 tablet 1 time per day for 10 days only   3. Continue montelukast 10 mg -1 tablet one time per day   4. Can use nasal ipratropium 0.06%- 2 sprays each nostril every 6 hours if needed to dry up nose if needed. Do not spray into midline septum  5. [place]  6. Return to clinic in 12 months or earlier if problem  Addley will utilize some anti-inflammatory agents for her eustachian tubes as noted above and hopefully her function will return and she will resolve what appears to be middle ear infusion.  If not, we will need to refer her onto ENT.  She is moving  to [place] on 19 February 2021 and I did give her the name of the allergy group in that area.  I will be very happy to see her back in this clinic in 12 months or she can follow-up with [facility]  in the future.  [name], MD Allergy / Immunology [place]

## 2021-01-28 NOTE — Patient Instructions (Addendum)
  1. Start OTC Nasacort - 1 spray each nostril 3 times per week. Do not spray into midline septum.   2. Start prednisone 10 mg - 1 tablet 1 time per day for 10 days only   3. Continue montelukast 10 mg -1 tablet one time per day   4. Can use nasal ipratropium 0.06%- 2 sprays each nostril every 6 hours if needed to dry up nose if needed. Do not spray into midline septum  5. [place]  6. Return to clinic in 12 months or earlier if problem

## 2021-01-29 ENCOUNTER — Encounter: Payer: Self-pay | Admitting: Allergy and Immunology

## 2021-02-01 ENCOUNTER — Other Ambulatory Visit: Payer: Self-pay | Admitting: Family Medicine

## 2021-02-20 DIAGNOSIS — H93293 Other abnormal auditory perceptions, bilateral: Secondary | ICD-10-CM | POA: Diagnosis not present

## 2021-02-23 DIAGNOSIS — H903 Sensorineural hearing loss, bilateral: Secondary | ICD-10-CM | POA: Diagnosis not present

## 2021-02-24 ENCOUNTER — Other Ambulatory Visit: Payer: Self-pay

## 2021-03-06 DIAGNOSIS — Z87898 Personal history of other specified conditions: Secondary | ICD-10-CM | POA: Diagnosis not present

## 2021-03-06 DIAGNOSIS — R200 Anesthesia of skin: Secondary | ICD-10-CM | POA: Diagnosis not present

## 2021-03-06 DIAGNOSIS — M545 Low back pain, unspecified: Secondary | ICD-10-CM | POA: Diagnosis not present

## 2021-03-06 DIAGNOSIS — R634 Abnormal weight loss: Secondary | ICD-10-CM | POA: Diagnosis not present

## 2021-03-11 DIAGNOSIS — H903 Sensorineural hearing loss, bilateral: Secondary | ICD-10-CM | POA: Diagnosis not present

## 2021-04-20 DIAGNOSIS — R202 Paresthesia of skin: Secondary | ICD-10-CM | POA: Diagnosis not present

## 2021-04-20 DIAGNOSIS — R200 Anesthesia of skin: Secondary | ICD-10-CM | POA: Diagnosis not present

## 2021-04-29 DIAGNOSIS — Z1231 Encounter for screening mammogram for malignant neoplasm of breast: Secondary | ICD-10-CM | POA: Diagnosis not present

## 2021-05-04 DIAGNOSIS — Z01419 Encounter for gynecological examination (general) (routine) without abnormal findings: Secondary | ICD-10-CM | POA: Diagnosis not present

## 2021-05-12 ENCOUNTER — Other Ambulatory Visit: Payer: Self-pay | Admitting: Allergy and Immunology

## 2021-06-10 DIAGNOSIS — H903 Sensorineural hearing loss, bilateral: Secondary | ICD-10-CM | POA: Diagnosis not present

## 2021-07-14 DIAGNOSIS — L820 Inflamed seborrheic keratosis: Secondary | ICD-10-CM | POA: Diagnosis not present

## 2021-07-14 DIAGNOSIS — L578 Other skin changes due to chronic exposure to nonionizing radiation: Secondary | ICD-10-CM | POA: Diagnosis not present

## 2021-07-14 DIAGNOSIS — L680 Hirsutism: Secondary | ICD-10-CM | POA: Diagnosis not present

## 2021-07-14 DIAGNOSIS — D229 Melanocytic nevi, unspecified: Secondary | ICD-10-CM | POA: Diagnosis not present

## 2021-07-14 DIAGNOSIS — L821 Other seborrheic keratosis: Secondary | ICD-10-CM | POA: Diagnosis not present

## 2021-07-14 DIAGNOSIS — L570 Actinic keratosis: Secondary | ICD-10-CM | POA: Diagnosis not present

## 2021-07-15 DIAGNOSIS — M85852 Other specified disorders of bone density and structure, left thigh: Secondary | ICD-10-CM | POA: Diagnosis not present

## 2021-07-15 DIAGNOSIS — Z780 Asymptomatic menopausal state: Secondary | ICD-10-CM | POA: Diagnosis not present

## 2021-07-17 ENCOUNTER — Encounter: Payer: Medicare PPO | Admitting: Family Medicine

## 2021-07-27 DIAGNOSIS — Z0000 Encounter for general adult medical examination without abnormal findings: Secondary | ICD-10-CM | POA: Diagnosis not present

## 2021-07-30 DIAGNOSIS — H04129 Dry eye syndrome of unspecified lacrimal gland: Secondary | ICD-10-CM | POA: Diagnosis not present

## 2021-07-30 DIAGNOSIS — H524 Presbyopia: Secondary | ICD-10-CM | POA: Diagnosis not present

## 2021-09-16 DIAGNOSIS — H903 Sensorineural hearing loss, bilateral: Secondary | ICD-10-CM | POA: Diagnosis not present

## 2021-09-22 ENCOUNTER — Ambulatory Visit: Payer: Medicare PPO | Admitting: Allergy and Immunology

## 2021-10-28 ENCOUNTER — Other Ambulatory Visit: Payer: Self-pay | Admitting: Family Medicine

## 2023-07-14 ENCOUNTER — Telehealth: Payer: Self-pay | Admitting: Family Medicine

## 2023-07-14 NOTE — Telephone Encounter (Signed)
Patient was last seen in 2022- called patient to set up an annual physical with the provider- no answer unable to leave voicemail
